# Patient Record
Sex: Male | Born: 1945
Health system: Southern US, Community
[De-identification: ages and names within clinical notes are randomized; demographics above are authoritative.]

## PROBLEM LIST (undated history)

## (undated) DIAGNOSIS — D509 Iron deficiency anemia, unspecified: Secondary | ICD-10-CM

## (undated) DIAGNOSIS — E7800 Pure hypercholesterolemia, unspecified: Secondary | ICD-10-CM

## (undated) DIAGNOSIS — K635 Polyp of colon: Secondary | ICD-10-CM

## (undated) DIAGNOSIS — I447 Left bundle-branch block, unspecified: Secondary | ICD-10-CM

## (undated) DIAGNOSIS — E119 Type 2 diabetes mellitus without complications: Secondary | ICD-10-CM

## (undated) DIAGNOSIS — G4730 Sleep apnea, unspecified: Secondary | ICD-10-CM

## (undated) DIAGNOSIS — N200 Calculus of kidney: Secondary | ICD-10-CM

## (undated) DIAGNOSIS — D563 Thalassemia minor: Secondary | ICD-10-CM

## (undated) DIAGNOSIS — I10 Essential (primary) hypertension: Secondary | ICD-10-CM

## (undated) HISTORY — DX: Thalassemia minor: D56.3

## (undated) HISTORY — DX: Left bundle-branch block, unspecified: I44.7

## (undated) HISTORY — DX: Calculus of kidney: N20.0

## (undated) HISTORY — PX: MENISCECTOMY: SHX123

## (undated) HISTORY — PX: ORIF FEMUR FRACTURE: SHX2119

## (undated) HISTORY — DX: Iron deficiency anemia, unspecified: D50.9

## (undated) HISTORY — DX: Polyp of colon: K63.5

---

## 2020-01-05 ENCOUNTER — Ambulatory Visit: Payer: Self-pay | Admitting: Family Medicine

## 2020-02-03 DIAGNOSIS — D509 Iron deficiency anemia, unspecified: Secondary | ICD-10-CM | POA: Diagnosis not present

## 2020-02-03 DIAGNOSIS — E118 Type 2 diabetes mellitus with unspecified complications: Secondary | ICD-10-CM | POA: Diagnosis not present

## 2020-02-03 DIAGNOSIS — E785 Hyperlipidemia, unspecified: Secondary | ICD-10-CM | POA: Diagnosis not present

## 2020-02-03 DIAGNOSIS — Z0001 Encounter for general adult medical examination with abnormal findings: Secondary | ICD-10-CM | POA: Diagnosis not present

## 2020-02-03 DIAGNOSIS — E538 Deficiency of other specified B group vitamins: Secondary | ICD-10-CM | POA: Diagnosis not present

## 2020-02-03 DIAGNOSIS — I10 Essential (primary) hypertension: Secondary | ICD-10-CM | POA: Diagnosis not present

## 2020-02-03 DIAGNOSIS — Z125 Encounter for screening for malignant neoplasm of prostate: Secondary | ICD-10-CM | POA: Diagnosis not present

## 2020-02-08 DIAGNOSIS — I447 Left bundle-branch block, unspecified: Secondary | ICD-10-CM | POA: Diagnosis not present

## 2020-02-08 DIAGNOSIS — D649 Anemia, unspecified: Secondary | ICD-10-CM | POA: Diagnosis not present

## 2020-02-08 DIAGNOSIS — E1169 Type 2 diabetes mellitus with other specified complication: Secondary | ICD-10-CM | POA: Diagnosis not present

## 2020-02-08 DIAGNOSIS — Z6834 Body mass index (BMI) 34.0-34.9, adult: Secondary | ICD-10-CM | POA: Diagnosis not present

## 2020-02-08 DIAGNOSIS — E785 Hyperlipidemia, unspecified: Secondary | ICD-10-CM | POA: Diagnosis not present

## 2020-02-08 DIAGNOSIS — E1122 Type 2 diabetes mellitus with diabetic chronic kidney disease: Secondary | ICD-10-CM | POA: Diagnosis not present

## 2020-02-08 DIAGNOSIS — I10 Essential (primary) hypertension: Secondary | ICD-10-CM | POA: Diagnosis not present

## 2020-02-08 DIAGNOSIS — Z0001 Encounter for general adult medical examination with abnormal findings: Secondary | ICD-10-CM | POA: Diagnosis not present

## 2020-02-14 ENCOUNTER — Encounter: Payer: Self-pay | Admitting: *Deleted

## 2020-04-24 ENCOUNTER — Ambulatory Visit: Payer: Self-pay

## 2020-05-10 DIAGNOSIS — Z79899 Other long term (current) drug therapy: Secondary | ICD-10-CM | POA: Diagnosis not present

## 2020-05-10 DIAGNOSIS — E539 Vitamin B deficiency, unspecified: Secondary | ICD-10-CM | POA: Diagnosis not present

## 2020-05-10 DIAGNOSIS — E118 Type 2 diabetes mellitus with unspecified complications: Secondary | ICD-10-CM | POA: Diagnosis not present

## 2020-05-10 DIAGNOSIS — D649 Anemia, unspecified: Secondary | ICD-10-CM | POA: Diagnosis not present

## 2020-05-10 DIAGNOSIS — E785 Hyperlipidemia, unspecified: Secondary | ICD-10-CM | POA: Diagnosis not present

## 2020-05-10 DIAGNOSIS — M1990 Unspecified osteoarthritis, unspecified site: Secondary | ICD-10-CM | POA: Diagnosis not present

## 2020-05-10 DIAGNOSIS — I10 Essential (primary) hypertension: Secondary | ICD-10-CM | POA: Diagnosis not present

## 2020-05-10 DIAGNOSIS — N183 Chronic kidney disease, stage 3 unspecified: Secondary | ICD-10-CM | POA: Diagnosis not present

## 2020-05-12 DIAGNOSIS — N1832 Chronic kidney disease, stage 3b: Secondary | ICD-10-CM | POA: Diagnosis not present

## 2020-05-12 DIAGNOSIS — M179 Osteoarthritis of knee, unspecified: Secondary | ICD-10-CM | POA: Diagnosis not present

## 2020-05-12 DIAGNOSIS — D508 Other iron deficiency anemias: Secondary | ICD-10-CM | POA: Diagnosis not present

## 2020-05-12 DIAGNOSIS — E785 Hyperlipidemia, unspecified: Secondary | ICD-10-CM | POA: Diagnosis not present

## 2020-05-12 DIAGNOSIS — E1122 Type 2 diabetes mellitus with diabetic chronic kidney disease: Secondary | ICD-10-CM | POA: Diagnosis not present

## 2020-05-30 ENCOUNTER — Other Ambulatory Visit: Payer: Self-pay

## 2020-05-30 ENCOUNTER — Ambulatory Visit (INDEPENDENT_AMBULATORY_CARE_PROVIDER_SITE_OTHER): Payer: Self-pay | Admitting: *Deleted

## 2020-05-30 VITALS — Ht 65.0 in | Wt 208.4 lb

## 2020-05-30 DIAGNOSIS — Z1211 Encounter for screening for malignant neoplasm of colon: Secondary | ICD-10-CM

## 2020-05-30 NOTE — Progress Notes (Addendum)
Gastroenterology Pre-Procedure Review  Request Date: 05/30/2020 Requesting Physician: Dr. Willey Blade, Last TCS done 20 years ago in [place], pt could not remember physician or place it was done at, polyps (benign) per pt, family hx colon cancer (mother)  PATIENT REVIEW QUESTIONS: The patient responded to the following health history questions as indicated:    1. Diabetes Melitis: yes 2. Joint replacements in the past 12 months: no 3. Major health problems in the past 3 months: no 4. Has an artificial valve or MVP: no 5. Has a defibrillator: no 6. Has been advised in past to take antibiotics in advance of a procedure like teeth cleaning: no 7. Family history of colon cancer: yes, mother: age 3  8. Alcohol Use: no 9. Illicit drug Use: no 10. History of sleep apnea: yes, CPAP  11. History of coronary artery or other vascular stents placed within the last 12 months: no 12. History of any prior anesthesia complications: no 13. Body mass index is 34.68 kg/m.    MEDICATIONS & ALLERGIES:    Patient reports the following regarding taking any blood thinners:   Plavix? no Aspirin? yes Coumadin? no Brilinta? no Xarelto? no Eliquis? no Pradaxa? no Savaysa? no Effient? no  Patient confirms/reports the following medications:  Current Outpatient Medications  Medication Sig Dispense Refill  . aspirin EC 81 MG tablet Take 81 mg by mouth daily. Swallow whole.    Marland Kitchen atorvastatin (LIPITOR) 40 MG tablet Take 40 mg by mouth daily.    . Cholecalciferol (EQL VITAMIN D3) 50 MCG (2000 UT) CAPS Take by mouth in the morning and at bedtime.    Marland Kitchen icosapent Ethyl (VASCEPA) 1 g capsule Take 1 g by mouth daily.    . Insulin Degludec (TRESIBA) 100 UNIT/ML SOLN Inject 25 mLs into the skin at bedtime.    Marland Kitchen loratadine-pseudoephedrine (CLARITIN-D 24-HOUR) 10-240 MG 24 hr tablet Take 1 tablet by mouth daily.    . pioglitazone (ACTOS) 30 MG tablet Take 30 mg by mouth daily.    . ramipril (ALTACE) 5 MG capsule Take 5  mg by mouth daily.    . vitamin B-12 (CYANOCOBALAMIN) 1000 MCG tablet Take 1,000 mcg by mouth daily.     No current facility-administered medications for this visit.    Patient confirms/reports the following allergies:  No Known Allergies  No orders of the defined types were placed in this encounter.   AUTHORIZATION INFORMATION Primary Insurance: Salli Quarry,  [place] #: [id number] Pre-Cert / Josem Kaufmann required: Yes, approved per Deidre from 75/05/183-35/82/5189 Pre-Cert / Auth #: [id number]  SCHEDULE INFORMATION: Procedure has been scheduled as follows:  Date: 06/30/2020, Time: 9:00 Location: [facility] with Dr. Abbey Chatters  This Gastroenterology Pre-Precedure Review Form is being routed to the following provider(s): Aliene Altes, PA

## 2020-05-30 NOTE — Progress Notes (Signed)
Ok to proceed with coloscopy. ASA II  Diabetes medication adjustments:  1 day prior: 1/2 dose Tresiba (12.5 mL) at bedtime, 1/2 dose Actose (15 mg) Day of procedure: Hold morning diabetes medications

## 2020-05-30 NOTE — Progress Notes (Signed)
Stopped iron around 02/2020 due to constipation.  Pt says he has appt tomorrow at [facility] to possibly start iron infusions.

## 2020-05-30 NOTE — Patient Instructions (Addendum)
[place]   Please notify us immediately if you are diabetic, take iron supplements, or if you are on coumadin or any blood thinners.   Patient Name: William Washington  Date of procedure: 07/07/2020 Time to register at [facility] Stay: 8:00 am Provider: Dr. Abbey Chatters   Purchase: MIRALAX 238 gram bottle, 1 FLEET ENEMA, 1 box of DULCOLAX (All over the counter medications)    07/05/2020- 2 Days prior to procedure: START CLEAR LIQUID DIET AFTER YOUR LUNCH MEAL--NO SOLID FOODS!   07/06/2020- 1 Day prior to procedure:   CLEAR LIQUIDS ALL DAY--NO SOLID FOODS!   Diabetic medication adjustments for today:    At 10:00 AM, take 2 DULCOLAX 9m tablets   At 12:00 PM, Mix 5 teaspoons of Miralax in any 4-6 ounces of CLEAR LIQUIDS (Gatorade) every hour for 5 hours until passing clear, watery stools. Be sure to drink 4 ounces of clear liquid 30 minutes after each dose of Miralax.   At 3:00 PM, take 2 Dulcolax 564mtablets   If stools are not clear & watery by 6:00 PM, take 5 teaspoons of Miralax every 30 minutes until stools are clear (no color)   You must have a complete prep to ensure the most effective cleaning.   CONTINUE CLEAR LIQUIDS ONLY UNTIL MIDNIGHT. Make a conscious effort to drink as much as you can before, during & after the preparation.    NOTHING TO EAT OR DRINK AFTER MIDNIGHT except for your heart, blood pressure & breathing medications. You may take them with a sip of clear liquids.    07/07/2020- Day of Procedure  Give yourself one Fleet enema about 1 hour prior to leaving for the hospital.   Diabetic medication adjustments for today:   You may take TYLENOL products. Please continue your regular medications unless we have instructed you otherwise.    Please note, on the day of your procedure you MUST be accompanied by an adult who is willing to assume responsibility for you at time of discharge. If you do not have such person with you,  your procedure will have to be rescheduled.                                                             Please leave ALL jewelry at home prior to coming to the hospital for your procedure.   *It is your responsibility to check with your insurance company for the benefits of coverage you have for this procedure. Unfortunately, not all insurance companies have benefits to cover all or part of these types of procedures. It is your responsibility to check your benefits, however we will be glad to assist you with any codes your insurance company may need.   Please note that most insurance companies will not cover a screening colonoscopy for people under the age of 5081For example, with some insurance companies you may have benefits for a screening colonoscopy, but if polyps are found the diagnosis will change and then you may have a deductible that will need to be met. Please make sure you check your benefits for screening colonoscopy as well as a diagnostic colonoscopy.    CLEAR LIQUIDS: (NO RED)  Jello Apple Juice White Grape Juice Water  Banana popsicles Kool-Aid Coffee(No cream or milk)  Tea (No cream or milk) Soft drinks Broth (fat free beef/chicken/vegetable)   Clear liquids allow you to see your fingers on the other side of the glass. Be sure they are NOT RED in color, cloudy, but CLEAR.   Do Not Eat:  Dairy products of any kind Cranberry juice  Tomato or V8 Juice Orange Juice  Grapefruit Juice Red Grape Juice  Solid foods like cereal, oatmeal, yogurt, fruits, vegetables, creamed soups, eggs, bread, etc   HELPFUL HINTS TO MAKE DRINKING EASIER:  -Make sure prep is extremely COLD. Refrigerate the night before. You may also put in freezer.  -You may try mixing Crystal Light or Country Time Lemonade if you prefer. MIx in small amounts. Add more if necessary.  -Trying drinking through a straw.  -Rinse mouth with water or mouthwash  between glasses to remove aftertaste.  -Try sipping on a cold beverage/ice popsicles between glasses of prep.  -Place a piece of sugar-free hard candy in mouth between glasses.  -If you become nauseated, try consuming smaller amounts or stretch out the time between glasses. Stop for 30 minutes to an hour & slowly start back drinking.   Call our office with any questions or concerns at [phone].   Thank You

## 2020-05-31 ENCOUNTER — Telehealth: Payer: Self-pay | Admitting: *Deleted

## 2020-05-31 ENCOUNTER — Other Ambulatory Visit: Payer: Self-pay

## 2020-05-31 ENCOUNTER — Inpatient Hospital Stay (HOSPITAL_COMMUNITY): Payer: Medicare HMO

## 2020-05-31 ENCOUNTER — Inpatient Hospital Stay (HOSPITAL_COMMUNITY): Payer: Medicare HMO | Attending: Hematology | Admitting: Hematology

## 2020-05-31 ENCOUNTER — Encounter: Payer: Self-pay | Admitting: *Deleted

## 2020-05-31 VITALS — BP 130/69 | HR 88 | Temp 97.1°F | Resp 18 | Ht 65.0 in | Wt 208.3 lb

## 2020-05-31 DIAGNOSIS — N1831 Chronic kidney disease, stage 3a: Secondary | ICD-10-CM

## 2020-05-31 DIAGNOSIS — D509 Iron deficiency anemia, unspecified: Secondary | ICD-10-CM | POA: Diagnosis not present

## 2020-05-31 DIAGNOSIS — I10 Essential (primary) hypertension: Secondary | ICD-10-CM

## 2020-05-31 DIAGNOSIS — R718 Other abnormality of red blood cells: Secondary | ICD-10-CM

## 2020-05-31 DIAGNOSIS — E538 Deficiency of other specified B group vitamins: Secondary | ICD-10-CM | POA: Insufficient documentation

## 2020-05-31 DIAGNOSIS — E1121 Type 2 diabetes mellitus with diabetic nephropathy: Secondary | ICD-10-CM

## 2020-05-31 DIAGNOSIS — E119 Type 2 diabetes mellitus without complications: Secondary | ICD-10-CM | POA: Insufficient documentation

## 2020-05-31 LAB — FOLATE: Folate: 8.5 ng/mL (ref >5.9)

## 2020-05-31 LAB — CBC WITH DIFFERENTIAL/PLATELET
Abs Immature Granulocytes: 0.04 10*3/uL (ref 0.00–0.07)
Basophils Absolute: 0 10*3/uL (ref 0.0–0.1)
Basophils Relative: 1 %
Eosinophils Absolute: 0.2 10*3/uL (ref 0.0–0.5)
Eosinophils Relative: 3 %
HCT: 29.6 % — ABNORMAL LOW (ref 39.0–52.0)
Hemoglobin: 9.1 g/dL — ABNORMAL LOW (ref 13.0–17.0)
Immature Granulocytes: 1 %
Lymphocytes Relative: 24 %
Lymphs Abs: 1.5 10*3/uL (ref 0.7–4.0)
MCH: 20.4 pg — ABNORMAL LOW (ref 26.0–34.0)
MCHC: 30.7 g/dL (ref 30.0–36.0)
MCV: 66.5 fL — ABNORMAL LOW (ref 80.0–100.0)
Monocytes Absolute: 0.6 10*3/uL (ref 0.1–1.0)
Monocytes Relative: 9 %
Neutro Abs: 3.7 10*3/uL (ref 1.7–7.7)
Neutrophils Relative %: 62 %
Platelets: 190 10*3/uL (ref 150–400)
RBC: 4.45 MIL/uL (ref 4.22–5.81)
RDW: 17.7 % — ABNORMAL HIGH (ref 11.5–15.5)
WBC: 6 10*3/uL (ref 4.0–10.5)
nRBC: 0 % (ref 0.0–0.2)

## 2020-05-31 LAB — FERRITIN: Ferritin: 18 ng/mL — ABNORMAL LOW (ref 24–336)

## 2020-05-31 LAB — RETICULOCYTES
Immature Retic Fract: 36.1 % — ABNORMAL HIGH (ref 2.3–15.9)
RBC.: 4.6 MIL/uL (ref 4.22–5.81)
Retic Count, Absolute: 121.4 10*3/uL (ref 19.0–186.0)
Retic Ct Pct: 2.6 % (ref 0.4–3.1)

## 2020-05-31 LAB — IRON AND TIBC
Iron: 62 ug/dL (ref 45–182)
Saturation Ratios: 15 % — ABNORMAL LOW (ref 17.9–39.5)
TIBC: 421 ug/dL (ref 250–450)
UIBC: 359 ug/dL

## 2020-05-31 LAB — LACTATE DEHYDROGENASE: LDH: 221 U/L — ABNORMAL HIGH (ref 98–192)

## 2020-05-31 LAB — VITAMIN B12: Vitamin B-12: 1040 pg/mL — ABNORMAL HIGH (ref 180–914)

## 2020-05-31 LAB — VITAMIN D 25 HYDROXY (VIT D DEFICIENCY, FRACTURES): Vit D, 25-Hydroxy: 40.82 ng/mL (ref 30–100)

## 2020-05-31 NOTE — Progress Notes (Signed)
Mailed letter to pt with diabetes medication adjustments.   

## 2020-05-31 NOTE — Telephone Encounter (Signed)
Mailed letter to pt with diabetes medication adjustments.   

## 2020-05-31 NOTE — Progress Notes (Signed)
CONSULT NOTE  Patient Care Team: Asencion Noble, MD as PCP - General (Internal Medicine) Gala Romney Cristopher Estimable, MD as Consulting Physician (Gastroenterology)  CHIEF COMPLAINTS/PURPOSE OF CONSULTATION: Iron deficiency anemia  HISTORY OF PRESENTING ILLNESS:  William Washington 74 y.o. male was sent here by Dr. Willey Blade his PCP for better management of iron deficiency anemia.  Patient reports he has been anemic since childhood.  He is of [place] descent and has a trait for thalassemia.  Patient reports he is trying to take oral iron therapy multiple times in the past and ends up very constipated.  He denies any bright red bleeding such as epistasis, hematuria or hematochezia.  His last colonoscopy was over 11 years ago where they saw polyps that were benign and hemorrhoids.  He is scheduled for his next colonoscopy in 1 month.  He denies a history of blood clots or blood transfusion.  He does have a history of CKD.  He also has obstructive sleep apnea where he wears a CPAP at night.  He denies any alcohol smoking or illicit drug use.  He reports his whole family is from [place] and all anemic.  He denies any pica and eats a variety of diet.  He has no prior history of diagnosis of cancer.  He has a family history positive for mother with pancreatic cancer and a daughter with breast cancer.  He lives at home alone and performs all his own ADLs.    MEDICAL HISTORY:  No past medical history on file.  SURGICAL HISTORY:   SOCIAL HISTORY: Social History   Socioeconomic History  . Marital status: Significant Other    Spouse name: Not on file  . Number of children: Not on file  . Years of education: Not on file  . Highest education level: Not on file  Occupational History  . Not on file  Tobacco Use  . Smoking status: Not on file  Substance and Sexual Activity  . Alcohol use: Not on file  . Drug use: Not on file  . Sexual activity: Not on file  Other Topics Concern  . Not on file  Social History  Narrative  . Not on file   Social Determinants of Health   Financial Resource Strain:   . Difficulty of Paying Living Expenses: Not on file  Food Insecurity:   . Worried About Charity fundraiser in the Last Year: Not on file  . Ran Out of Food in the Last Year: Not on file  Transportation Needs:   . Lack of Transportation (Medical): Not on file  . Lack of Transportation (Non-Medical): Not on file  Physical Activity:   . Days of Exercise per Week: Not on file  . Minutes of Exercise per Session: Not on file  Stress:   . Feeling of Stress : Not on file  Social Connections:   . Frequency of Communication with Friends and Family: Not on file  . Frequency of Social Gatherings with Friends and Family: Not on file  . Attends Religious Services: Not on file  . Active Member of Clubs or Organizations: Not on file  . Attends Archivist Meetings: Not on file  . Marital Status: Not on file  Intimate Partner Violence:   . Fear of Current or Ex-Partner: Not on file  . Emotionally Abused: Not on file  . Physically Abused: Not on file  . Sexually Abused: Not on file    FAMILY HISTORY: No family history on file.  ALLERGIES:  has No Known Allergies.  MEDICATIONS:  Current Outpatient Medications  Medication Sig Dispense Refill  . aspirin EC 81 MG tablet Take 81 mg by mouth daily. Swallow whole.    Marland Kitchen atorvastatin (LIPITOR) 40 MG tablet Take 40 mg by mouth daily.    . Cholecalciferol (EQL VITAMIN D3) 50 MCG (2000 UT) CAPS Take by mouth in the morning and at bedtime.    . Insulin Degludec (TRESIBA) 100 UNIT/ML SOLN Inject 25 mLs into the skin at bedtime.    Marland Kitchen loratadine-pseudoephedrine (CLARITIN-D 24-HOUR) 10-240 MG 24 hr tablet Take 1 tablet by mouth daily.    . Omega-3 Fatty Acids (FISH OIL) 1000 MG CAPS Take 2 capsules by mouth 2 (two) times daily.    . pioglitazone (ACTOS) 30 MG tablet Take 30 mg by mouth daily.    . ramipril (ALTACE) 5 MG capsule Take 5 mg by mouth daily.     . Turmeric (QC TUMERIC COMPLEX) 500 MG CAPS Take by mouth. Takes 2 capsules daily.    . vitamin B-12 (CYANOCOBALAMIN) 1000 MCG tablet Take 1,000 mcg by mouth daily.     No current facility-administered medications for this visit.    REVIEW OF SYSTEMS:   Constitutional: Denies fevers, chills or abnormal night sweats Eyes: Denies blurriness of vision, double vision or watery eyes Ears, nose, mouth, throat, and face: Denies mucositis or sore throat Respiratory: Denies cough, dyspnea or wheezes, +SOB Cardiovascular: Denies palpitation, chest discomfort or lower extremity swelling Gastrointestinal:  Denies nausea, heartburn or change in bowel habits Skin: Denies abnormal skin rashes Lymphatics: Denies new lymphadenopathy or easy bruising Neurological:Denies numbness, tingling or new weaknesses Behavioral/Psych: Mood is stable, no new changes  All other systems were reviewed with the patient and are negative.  PHYSICAL EXAMINATION: ECOG PERFORMANCE STATUS: 1 - Symptomatic but completely ambulatory  Vitals:   05/31/20 1004  BP: 130/69  Pulse: 88  Resp: 18  Temp: (!) 97.1 F (36.2 C)  SpO2: 99%   Filed Weights   05/31/20 1004  Weight: 208 lb 4.8 oz (94.5 kg)    GENERAL:alert, no distress and comfortable SKIN: skin color, texture, turgor are normal, no rashes or significant lesions NECK: supple, thyroid normal size, non-tender, without nodularity LYMPH:  no palpable lymphadenopathy in the cervical, axillary or inguinal LUNGS: clear to auscultation and percussion with normal breathing effort HEART: regular rate & rhythm and no murmurs and no lower extremity edema ABDOMEN:abdomen soft, non-tender and normal bowel sounds Musculoskeletal:no cyanosis of digits and no clubbing  PSYCH: alert & oriented x 3 with fluent speech NEURO: no focal motor/sensory deficits  LABORATORY DATA:  I have reviewed the data as listed Recent Results (from the past 2160 hour(s))  Reticulocytes      Status: Abnormal   Collection Time: 05/31/20 12:11 PM  Result Value Ref Range   Retic Ct Pct 2.6 0.4 - 3.1 %   RBC. 4.60 4.22 - 5.81 MIL/uL   Retic Count, Absolute 121.4 19.0 - 186.0 K/uL   Immature Retic Fract 36.1 (H) 2.3 - 15.9 %    Comment: Performed at [facility], [address] [place], [place] [address]  Lactate dehydrogenase     Status: Abnormal   Collection Time: 05/31/20 12:12 PM  Result Value Ref Range   LDH 221 (H) 98 - 192 U/L    Comment: Performed at [facility], [address] [place], [place] [address]    RADIOGRAPHIC STUDIES: I have personally reviewed the radiological images as listed and agreed with the  findings in the report. I have independently interviewed and examined this patient.  I agree with H&P written by my nurse practitioner Wenda Low, FNP.  I have independently formulated my assessment and plan.  ASSESSMENT & PLAN:  1.  Microcytic anemia: -Patient seen at the request of Dr. Willey Blade.  CBC on 05/10/2020 showed hemoglobin 9.9 with MCV of 64.  White count and platelets were normal.  Vitamin B12 was 312. -CBC on 02/03/2020 with hemoglobin 9.3, hematocrit 32, platelet count 306. -He has a history of thalassemia trait.  He has [place] ancestry.  Most of his family members reportedly have thalassemia trait. -He denies any history of blood transfusion.  Family history with mother with pancreatic cancer and daughter with breast cancer. -Last colonoscopy was more than 10 years ago.  Denies any bleeding per rectum or melena. -Cannot tolerate iron pills secondary to severe constipation. -We will check his CBC today.  We will check for other nutritional deficiencies and hemolysis. -I have recommended Feraheme weekly x2.  We discussed about side effects. -We will plan to see him back in 6 to 8 weeks with repeat CBC.  2.  CKD: -He has chronic kidney disease with creatinine around 1.5.  3.  B12 deficiency: -B12 level was low at 217 on 02/03/2020. -He has been  taking B12 1 mg tablet daily since then.  4.  Diabetes: -Continue Tresiba and pioglitazone.   All questions were answered. The patient knows to call the clinic with any problems, questions or concerns.      Derek Jack, MD 05/31/20 12:53 PM

## 2020-06-01 ENCOUNTER — Other Ambulatory Visit: Payer: Self-pay | Admitting: *Deleted

## 2020-06-01 ENCOUNTER — Telehealth: Payer: Self-pay | Admitting: *Deleted

## 2020-06-01 LAB — PROTEIN ELECTROPHORESIS, SERUM
A/G Ratio: 1.3 (ref 0.7–1.7)
Albumin ELP: 3.5 g/dL (ref 2.9–4.4)
Alpha-1-Globulin: 0.2 g/dL (ref 0.0–0.4)
Alpha-2-Globulin: 0.6 g/dL (ref 0.4–1.0)
Beta Globulin: 1 g/dL (ref 0.7–1.3)
Gamma Globulin: 1 g/dL (ref 0.4–1.8)
Globulin, Total: 2.8 g/dL (ref 2.2–3.9)
Total Protein ELP: 6.3 g/dL (ref 6.0–8.5)

## 2020-06-01 LAB — HAPTOGLOBIN: Haptoglobin: 78 mg/dL (ref 34–355)

## 2020-06-01 NOTE — Telephone Encounter (Signed)
Called [employer] and received authorization for procedure.  Good from 06/30/2020-07/30/2020 per Deidre. Auth #: [id number]

## 2020-06-02 ENCOUNTER — Other Ambulatory Visit: Payer: Self-pay

## 2020-06-02 ENCOUNTER — Inpatient Hospital Stay (HOSPITAL_COMMUNITY): Payer: Medicare HMO

## 2020-06-02 VITALS — BP 134/61 | HR 93 | Temp 96.8°F | Resp 18

## 2020-06-02 DIAGNOSIS — D509 Iron deficiency anemia, unspecified: Secondary | ICD-10-CM | POA: Diagnosis not present

## 2020-06-02 DIAGNOSIS — E538 Deficiency of other specified B group vitamins: Secondary | ICD-10-CM | POA: Diagnosis not present

## 2020-06-02 DIAGNOSIS — E1121 Type 2 diabetes mellitus with diabetic nephropathy: Secondary | ICD-10-CM | POA: Diagnosis not present

## 2020-06-02 DIAGNOSIS — I10 Essential (primary) hypertension: Secondary | ICD-10-CM | POA: Diagnosis not present

## 2020-06-02 DIAGNOSIS — N1831 Chronic kidney disease, stage 3a: Secondary | ICD-10-CM | POA: Diagnosis not present

## 2020-06-02 MED ORDER — SODIUM CHLORIDE 0.9 % IV SOLN
510.0000 mg | Freq: Once | INTRAVENOUS | Status: AC
  Administered 2020-06-02: 510 mg via INTRAVENOUS
  Filled 2020-06-02: qty 510

## 2020-06-02 MED ORDER — SODIUM CHLORIDE 0.9 % IV SOLN: Freq: Once | INTRAVENOUS | Status: AC

## 2020-06-02 NOTE — Patient Instructions (Signed)
[facility]  Discharge Instructions:   _______________________________________________________________  Thank you for choosing [facility] at [facility] to provide your oncology and hematology care.  To afford each patient quality time with our providers, please arrive at least 15 minutes before your scheduled appointment.  You need to re-schedule your appointment if you arrive 10 or more minutes late.  We strive to give you quality time with our providers, and arriving late affects you and other patients whose appointments are after yours.  Also, if you no show three or more times for appointments you may be dismissed from the clinic.  Again, thank you for choosing [facility] at [facility]. Our hope is that these requests will allow you access to exceptional care and in a timely manner. _______________________________________________________________  If you have questions after your visit, please contact our office at [phone] between the hours of 8:30 a.m. and 5:00 p.m. Voicemails left after 4:30 p.m. will not be returned until the following business day. _______________________________________________________________  For prescription refill requests, have your pharmacy contact our office. _______________________________________________________________  Recommendations made by the consultant and any test results will be sent to your referring physician. _______________________________________________________________ 

## 2020-06-02 NOTE — Progress Notes (Signed)
Iron infusion given per orders. Patient tolerated it well without problems. Vitals stable and discharged home from clinic ambulatory in stable condition. Follow up as scheduled.  

## 2020-06-06 LAB — COPPER, SERUM: Copper: 99 ug/dL (ref 69–132)

## 2020-06-07 LAB — METHYLMALONIC ACID, SERUM: Methylmalonic Acid, Quantitative: 207 nmol/L (ref 0–378)

## 2020-06-09 ENCOUNTER — Ambulatory Visit (HOSPITAL_COMMUNITY): Payer: Medicare HMO

## 2020-06-12 ENCOUNTER — Other Ambulatory Visit: Payer: Self-pay

## 2020-06-12 ENCOUNTER — Encounter (HOSPITAL_COMMUNITY): Payer: Self-pay

## 2020-06-12 ENCOUNTER — Telehealth: Payer: Self-pay | Admitting: Internal Medicine

## 2020-06-12 ENCOUNTER — Inpatient Hospital Stay (HOSPITAL_COMMUNITY): Payer: Medicare HMO | Attending: Hematology

## 2020-06-12 VITALS — BP 141/71 | HR 77 | Temp 98.0°F | Resp 16

## 2020-06-12 DIAGNOSIS — N1831 Chronic kidney disease, stage 3a: Secondary | ICD-10-CM | POA: Diagnosis not present

## 2020-06-12 DIAGNOSIS — E1121 Type 2 diabetes mellitus with diabetic nephropathy: Secondary | ICD-10-CM | POA: Diagnosis not present

## 2020-06-12 DIAGNOSIS — D509 Iron deficiency anemia, unspecified: Secondary | ICD-10-CM | POA: Insufficient documentation

## 2020-06-12 DIAGNOSIS — I10 Essential (primary) hypertension: Secondary | ICD-10-CM | POA: Diagnosis not present

## 2020-06-12 DIAGNOSIS — E538 Deficiency of other specified B group vitamins: Secondary | ICD-10-CM | POA: Insufficient documentation

## 2020-06-12 MED ORDER — SODIUM CHLORIDE 0.9 % IV SOLN: Freq: Once | INTRAVENOUS | Status: AC

## 2020-06-12 MED ORDER — SODIUM CHLORIDE 0.9 % IV SOLN
510.0000 mg | Freq: Once | INTRAVENOUS | Status: AC
  Administered 2020-06-12: 510 mg via INTRAVENOUS
  Filled 2020-06-12: qty 510

## 2020-06-12 NOTE — Telephone Encounter (Signed)
Patient called to reschedule his procedure

## 2020-06-12 NOTE — Progress Notes (Signed)
Tolerated iron infusion well today.  Vital signs stable prior to discharge.  Discharged in stable condition ambulatory.

## 2020-06-12 NOTE — Patient Instructions (Signed)
[facility]  Discharge Instructions:   _______________________________________________________________  Thank you for choosing [facility] at [facility] to provide your oncology and hematology care.  To afford each patient quality time with our providers, please arrive at least 15 minutes before your scheduled appointment.  You need to re-schedule your appointment if you arrive 10 or more minutes late.  We strive to give you quality time with our providers, and arriving late affects you and other patients whose appointments are after yours.  Also, if you no show three or more times for appointments you may be dismissed from the clinic.  Again, thank you for choosing [facility] at [facility]. Our hope is that these requests will allow you access to exceptional care and in a timely manner. _______________________________________________________________  If you have questions after your visit, please contact our office at [phone] between the hours of 8:30 a.m. and 5:00 p.m. Voicemails left after 4:30 p.m. will not be returned until the following business day. _______________________________________________________________  For prescription refill requests, have your pharmacy contact our office. _______________________________________________________________  Recommendations made by the consultant and any test results will be sent to your referring physician. _______________________________________________________________ 

## 2020-06-13 ENCOUNTER — Ambulatory Visit (INDEPENDENT_AMBULATORY_CARE_PROVIDER_SITE_OTHER): Payer: Medicare HMO

## 2020-06-13 ENCOUNTER — Ambulatory Visit (INDEPENDENT_AMBULATORY_CARE_PROVIDER_SITE_OTHER): Payer: Medicare HMO | Admitting: Orthopedic Surgery

## 2020-06-13 ENCOUNTER — Encounter: Payer: Self-pay | Admitting: *Deleted

## 2020-06-13 ENCOUNTER — Encounter: Payer: Self-pay | Admitting: Orthopedic Surgery

## 2020-06-13 VITALS — BP 140/74 | HR 91 | Ht 65.0 in | Wt 206.0 lb

## 2020-06-13 DIAGNOSIS — E782 Mixed hyperlipidemia: Secondary | ICD-10-CM

## 2020-06-13 DIAGNOSIS — M25561 Pain in right knee: Secondary | ICD-10-CM

## 2020-06-13 DIAGNOSIS — G8929 Other chronic pain: Secondary | ICD-10-CM | POA: Diagnosis not present

## 2020-06-13 DIAGNOSIS — M1711 Unilateral primary osteoarthritis, right knee: Secondary | ICD-10-CM

## 2020-06-13 DIAGNOSIS — E785 Hyperlipidemia, unspecified: Secondary | ICD-10-CM | POA: Insufficient documentation

## 2020-06-13 NOTE — Patient Instructions (Signed)

## 2020-06-13 NOTE — Progress Notes (Signed)
New Patient Visit  Assessment: William Washington is a 74 y.o. male with the following: 1. Right knee arthritis; primarily medial compartment   Plan: I had extensive discussion with Mr. Orihuela in clinic today regarding his right knee.  We reviewed the x-rays, which demonstrates a possible osteochondral lesion within the medial femoral condyle.  This is likely contributing to some arthritis type pain.  He specifically denies any mechanical symptoms, and states that it does not feel as though there is a loose body within his knee.  As a result, I have encouraged him to continue taking medications as needed including Tylenol and NSAIDs.  We also discussed the possibility of proceeding with a steroid injection.  He has elected to proceed.  Otherwise, he should remain as active as possible.  All questions were answered he is amenable to this plan.  He has any further issues with his right knee, her left knee starts to bother him, I have encouraged him to contact the clinic for another appointment   Procedure note injection Right knee joint   Verbal consent was obtained to inject the right knee joint  Timeout was completed to confirm the site of injection.  The skin was prepped with alcohol and ethyl chloride was sprayed at the injection site.  A 21-gauge needle was used to inject 40 mg of Depo-Medrol and 1% lidocaine (3 cc) into the right knee using an anterolateral approach.  There were no complications. A sterile bandage was applied.     Follow-up: Return if symptoms worsen or fail to improve.  Subjective:  Chief Complaint  Patient presents with  . Knee Pain    right knee pain, chronic, worsening x 6 months    History of Present Illness: William Washington is a 74 y.o. male who has been referred to clinic today by Asencion Noble, MD for evaluation of right knee pain.  He states his right knee has been bothering him over the last 6 months.  His pain has been progressively worsening.  The pain is  primarily located in the medial aspect of the knee.  He denies a specific injury, but notes that he has had multiple procedures on his left knee, including a femur fracture within the last 2 years, which is put more stress on his right leg.  He also denies a history of any knee pain as a child or young teenager.  He has not been doing physical therapy, but has remained active.  Review of Systems: No fevers or chills No numbness or tingling No chest pain No shortness of breath  Medical History:  Diabetes Hypertension Dyslipidemia  Left knee arthroscopy Left femur IMN  Family History  Problem Relation Age of Onset  . Severe sprains Neg Hx   . Scoliosis Neg Hx    Social History   Tobacco Use  . Smoking status: Never Smoker  . Smokeless tobacco: Never Used  Substance Use Topics  . Alcohol use: Not on file  . Drug use: Not on file    No Known Allergies  Current Meds  Medication Sig  . aspirin EC 81 MG tablet Take 81 mg by mouth daily. Swallow whole.  Marland Kitchen atorvastatin (LIPITOR) 40 MG tablet Take 40 mg by mouth daily.  . Cholecalciferol (EQL VITAMIN D3) 50 MCG (2000 UT) CAPS Take by mouth in the morning and at bedtime.  . Insulin Degludec (TRESIBA) 100 UNIT/ML SOLN Inject 25 mLs into the skin at bedtime.  . iron dextran complex in sodium chloride 0.9 %  500 mL Inject into the vein once.  . loratadine-pseudoephedrine (CLARITIN-D 24-HOUR) 10-240 MG 24 hr tablet Take 1 tablet by mouth daily.  . Omega-3 Fatty Acids (FISH OIL) 1000 MG CAPS Take 2 capsules by mouth 2 (two) times daily.  . pioglitazone (ACTOS) 30 MG tablet Take 30 mg by mouth daily.  . ramipril (ALTACE) 5 MG capsule Take 5 mg by mouth daily.  . Turmeric (QC TUMERIC COMPLEX) 500 MG CAPS Take by mouth. Takes 2 capsules daily.  . vitamin B-12 (CYANOCOBALAMIN) 1000 MCG tablet Take 1,000 mcg by mouth daily.    Objective: BP 140/74   Pulse 91   Ht 5\' 5"  (1.651 m)   Wt 206 lb (93.4 kg)   BMI 34.28 kg/m   Physical  Exam:  General: Alert and oriented, no acute distress Gait: Mild right sided antalgic gait  Evaluation of the right knee demonstrates no obvious deformity Mild effusion is appreciated Tenderness to palpation along the medial joint line Negative McMurray's Range of motion motion from 0-120 degrees  Negative Lachman  Evaluation of left knee demonstrates well-healed surgical incisions. No effusion is appreciated Mild tenderness palpation along the medial and lateral joint line Range of motion from 15-120 degrees Negative Lachman Negative McMurray's     IMAGING: I personally ordered and reviewed the following images:  X-ray of the right knee demonstrates neutral overall alignment.  Joint spaces are well-maintained.  The medial femoral condyle has what appears to be an osteochondral fragment, that is not displaced.  Small osteophytes are noted.  New Medications:  No orders of the defined types were placed in this encounter.     William Rasmussen, MD  06/13/2020 3:45 PM

## 2020-06-13 NOTE — Telephone Encounter (Addendum)
Spoke with pt and he rescheduled his procedure to 07/07/2020 with arrival time at 8:00 and his Covid screening to 07/05/2020 at 11:05.  Pt aware that I will mail out new prep instructions, diabetes medication adjustment letter and Covid screening info.  Lmom for [facility] in Endo.

## 2020-06-28 ENCOUNTER — Other Ambulatory Visit (HOSPITAL_COMMUNITY): Payer: Medicare HMO

## 2020-07-04 ENCOUNTER — Encounter (HOSPITAL_COMMUNITY): Payer: Self-pay

## 2020-07-05 ENCOUNTER — Other Ambulatory Visit (HOSPITAL_COMMUNITY)
Admission: RE | Admit: 2020-07-05 | Discharge: 2020-07-05 | Disposition: A | Discharge status: Home / Self Care | Payer: Medicare HMO | Source: Ambulatory Visit | Attending: Internal Medicine | Admitting: Internal Medicine

## 2020-07-05 ENCOUNTER — Other Ambulatory Visit: Payer: Self-pay

## 2020-07-05 DIAGNOSIS — Z20822 Contact with and (suspected) exposure to covid-19: Secondary | ICD-10-CM | POA: Insufficient documentation

## 2020-07-05 DIAGNOSIS — Z01812 Encounter for preprocedural laboratory examination: Secondary | ICD-10-CM | POA: Insufficient documentation

## 2020-07-05 LAB — SARS CORONAVIRUS 2 (TAT 6-24 HRS): SARS Coronavirus 2: NEGATIVE

## 2020-07-07 ENCOUNTER — Ambulatory Visit (HOSPITAL_COMMUNITY)
Admission: RE | Admit: 2020-07-07 | Discharge: 2020-07-07 | Disposition: A | Discharge status: Home / Self Care | Payer: Medicare HMO | Attending: Internal Medicine | Admitting: Internal Medicine

## 2020-07-07 ENCOUNTER — Encounter (HOSPITAL_COMMUNITY): Payer: Self-pay

## 2020-07-07 ENCOUNTER — Other Ambulatory Visit: Payer: Self-pay

## 2020-07-07 ENCOUNTER — Ambulatory Visit (HOSPITAL_COMMUNITY): Payer: Medicare HMO | Admitting: Anesthesiology

## 2020-07-07 ENCOUNTER — Encounter (HOSPITAL_COMMUNITY)
Admission: RE | Disposition: A | Discharge status: Home / Self Care | Payer: Self-pay | Source: Home / Self Care | Attending: Internal Medicine

## 2020-07-07 DIAGNOSIS — K5730 Diverticulosis of large intestine without perforation or abscess without bleeding: Secondary | ICD-10-CM | POA: Insufficient documentation

## 2020-07-07 DIAGNOSIS — E119 Type 2 diabetes mellitus without complications: Secondary | ICD-10-CM | POA: Diagnosis not present

## 2020-07-07 DIAGNOSIS — K635 Polyp of colon: Secondary | ICD-10-CM | POA: Diagnosis not present

## 2020-07-07 DIAGNOSIS — Z1211 Encounter for screening for malignant neoplasm of colon: Secondary | ICD-10-CM

## 2020-07-07 DIAGNOSIS — Z794 Long term (current) use of insulin: Secondary | ICD-10-CM | POA: Insufficient documentation

## 2020-07-07 DIAGNOSIS — Z800 Family history of malignant neoplasm of digestive organs: Secondary | ICD-10-CM | POA: Diagnosis not present

## 2020-07-07 DIAGNOSIS — K648 Other hemorrhoids: Secondary | ICD-10-CM | POA: Insufficient documentation

## 2020-07-07 DIAGNOSIS — K5140 Inflammatory polyps of colon without complications: Secondary | ICD-10-CM | POA: Diagnosis not present

## 2020-07-07 DIAGNOSIS — D122 Benign neoplasm of ascending colon: Secondary | ICD-10-CM | POA: Diagnosis not present

## 2020-07-07 DIAGNOSIS — D123 Benign neoplasm of transverse colon: Secondary | ICD-10-CM | POA: Insufficient documentation

## 2020-07-07 HISTORY — PX: COLONOSCOPY WITH PROPOFOL: SHX5780

## 2020-07-07 HISTORY — DX: Pure hypercholesterolemia, unspecified: E78.00

## 2020-07-07 HISTORY — DX: Type 2 diabetes mellitus without complications: E11.9

## 2020-07-07 HISTORY — DX: Essential (primary) hypertension: I10

## 2020-07-07 HISTORY — PX: POLYPECTOMY: SHX5525

## 2020-07-07 LAB — GLUCOSE, CAPILLARY: Glucose-Capillary: 102 mg/dL — ABNORMAL HIGH (ref 70–99)

## 2020-07-07 SURGERY — COLONOSCOPY WITH PROPOFOL
Anesthesia: General

## 2020-07-07 MED ORDER — CHLORHEXIDINE GLUCONATE CLOTH 2 % EX PADS: 6.0000 | MEDICATED_PAD | Freq: Once | CUTANEOUS | Status: DC

## 2020-07-07 MED ORDER — STERILE WATER FOR IRRIGATION IR SOLN
Status: DC | PRN
  Administered 2020-07-07: 1.5 mL

## 2020-07-07 MED ORDER — LACTATED RINGERS IV SOLN
INTRAVENOUS | Status: DC
  Administered 2020-07-07: 1000 mL via INTRAVENOUS

## 2020-07-07 MED ORDER — PROPOFOL 500 MG/50ML IV EMUL
INTRAVENOUS | Status: DC | PRN
  Administered 2020-07-07: 150 ug/kg/min via INTRAVENOUS

## 2020-07-07 MED ORDER — PROPOFOL 10 MG/ML IV BOLUS
INTRAVENOUS | Status: AC
  Filled 2020-07-07: qty 60

## 2020-07-07 MED ORDER — PROPOFOL 10 MG/ML IV BOLUS
INTRAVENOUS | Status: DC | PRN
  Administered 2020-07-07: 20 mg via INTRAVENOUS
  Administered 2020-07-07 (×2): 50 mg via INTRAVENOUS
  Administered 2020-07-07: 20 mg via INTRAVENOUS
  Administered 2020-07-07: 40 mg via INTRAVENOUS

## 2020-07-07 NOTE — Anesthesia Preprocedure Evaluation (Addendum)
Anesthesia Evaluation  Patient identified by MRN, date of birth, ID band Patient awake    Reviewed: Allergy & Precautions, NPO status , Patient's Chart, lab work & pertinent test results, reviewed documented beta blocker date and time   History of Anesthesia Complications Negative for: history of anesthetic complications  Airway Mallampati: II  TM Distance: >3 FB Neck ROM: Full    Dental no notable dental hx.    Pulmonary neg pulmonary ROS,    Pulmonary exam normal breath sounds clear to auscultation       Cardiovascular hypertension, Normal cardiovascular exam Rhythm:Regular Rate:Normal     Neuro/Psych    GI/Hepatic   Endo/Other  diabetes, Type 2  Renal/GU      Musculoskeletal   Abdominal   Peds  Hematology  (+) anemia ,   Anesthesia Other Findings   Reproductive/Obstetrics                            Anesthesia Physical Anesthesia Plan  ASA: II  Anesthesia Plan: General   Post-op Pain Management:    Induction: Intravenous  PONV Risk Score and Plan:   Airway Management Planned: Nasal Cannula  Additional Equipment:   Intra-op Plan:   Post-operative Plan:   Informed Consent: I have reviewed the patients History and Physical, chart, labs and discussed the procedure including the risks, benefits and alternatives for the proposed anesthesia with the patient or authorized representative who has indicated his/her understanding and acceptance.     Dental advisory given  Plan Discussed with: CRNA  Anesthesia Plan Comments:         Anesthesia Quick Evaluation

## 2020-07-07 NOTE — Anesthesia Postprocedure Evaluation (Signed)
Anesthesia Post Note  Patient: William Washington  Procedure(s) Performed: COLONOSCOPY WITH PROPOFOL (N/A ) POLYPECTOMY  Patient location during evaluation: Endoscopy Anesthesia Type: General Level of consciousness: awake and alert and oriented Pain management: pain level controlled Vital Signs Assessment: post-procedure vital signs reviewed and stable Respiratory status: spontaneous breathing Cardiovascular status: blood pressure returned to baseline and stable Postop Assessment: no apparent nausea or vomiting Anesthetic complications: no   No complications documented.   Last Vitals:  Vitals:   07/07/20 0727  BP: 134/71  Pulse: 88  Resp: 12  Temp: 36.6 C    Last Pain:  Vitals:   07/07/20 0849  TempSrc:   PainSc: 0-No pain                 Sion Thane

## 2020-07-07 NOTE — H&P (Signed)
Primary Care Physician:  Asencion Noble, MD Primary Gastroenterologist:  Dr. Abbey Chatters  Pre-Procedure History & Physical: HPI:  William Washington is a 74 y.o. male is here for a colonoscopy for colon cancer screening purposes.  Patient denies any family history of colorectal cancer. Does note mother had pancreatic cancer.  No melena or hematochezia.  No abdominal pain or unintentional weight loss.  No change in bowel habits.  Overall feels well from a GI standpoint.  Past Medical History:  Diagnosis Date  . Diabetes mellitus without complication ([place])   . Hypercholesteremia   . Hypertension     Past Surgical History:  Procedure Laterality Date  . MENISCECTOMY Left   . ORIF FEMUR FRACTURE Left     Prior to Admission medications   Medication Sig Start Date End Date Taking? Authorizing Provider  aspirin EC 81 MG tablet Take 81 mg by mouth daily. Swallow whole.   Yes [provider]  atorvastatin (LIPITOR) 40 MG tablet Take 40 mg by mouth at bedtime.    Yes [provider]  Cholecalciferol (EQL VITAMIN D3) 50 MCG (2000 UT) CAPS Take 2,000 Units by mouth in the morning and at bedtime.    Yes [provider]  Insulin Degludec (TRESIBA) 100 UNIT/ML SOLN Inject 25 Units into the skin at bedtime.    Yes [provider]  iron dextran complex in sodium chloride 0.9 % 500 mL Inject into the vein once.   Yes [provider]  loratadine-pseudoephedrine (CLARITIN-D 24-HOUR) 10-240 MG 24 hr tablet Take 1 tablet by mouth every other day.    Yes [provider]  Omega-3 Fatty Acids (FISH OIL) 1000 MG CAPS Take 2,000 mg by mouth 2 (two) times daily.    Yes [provider]  oxymetazoline (AFRIN) 0.05 % nasal spray Place 1 spray into both nostrils daily as needed for congestion.   Yes [provider]  pioglitazone (ACTOS) 30 MG tablet Take 30 mg by mouth daily.   Yes [provider]  ramipril (ALTACE) 5 MG capsule Take 5 mg by mouth  daily.   Yes [provider]  Turmeric (QC TUMERIC COMPLEX) 500 MG CAPS Take 1,000 mg by mouth daily.    Yes [provider]  vitamin B-12 (CYANOCOBALAMIN) 1000 MCG tablet Take 1,000 mcg by mouth daily.   Yes [provider]    Allergies as of 05/31/2020  . (No Known Allergies)    Family History  Problem Relation Age of Onset  . Severe sprains Neg Hx   . Scoliosis Neg Hx     Social History   Socioeconomic History  . Marital status: Significant Other    Spouse name: Not on file  . Number of children: Not on file  . Years of education: Not on file  . Highest education level: Not on file  Occupational History  . Not on file  Tobacco Use  . Smoking status: Never Smoker  . Smokeless tobacco: Never Used  Vaping Use  . Vaping Use: Never used  Substance and Sexual Activity  . Alcohol use: Yes    Comment: rare  . Drug use: Never  . Sexual activity: Not on file  Other Topics Concern  . Not on file  Social History Narrative  . Not on file   Social Determinants of Health   Financial Resource Strain:   . Difficulty of Paying Living Expenses: Not on file  Food Insecurity:   . Worried About Charity fundraiser in the Last  Year: Not on file  . Ran Out of Food in the Last Year: Not on file  Transportation Needs:   . Lack of Transportation (Medical): Not on file  . Lack of Transportation (Non-Medical): Not on file  Physical Activity:   . Days of Exercise per Week: Not on file  . Minutes of Exercise per Session: Not on file  Stress:   . Feeling of Stress : Not on file  Social Connections:   . Frequency of Communication with Friends and Family: Not on file  . Frequency of Social Gatherings with Friends and Family: Not on file  . Attends Religious Services: Not on file  . Active Member of Clubs or Organizations: Not on file  . Attends Archivist Meetings: Not on file  . Marital Status: Not on file  Intimate Partner Violence:   . Fear of  Current or Ex-Partner: Not on file  . Emotionally Abused: Not on file  . Physically Abused: Not on file  . Sexually Abused: Not on file    Review of Systems: See HPI, otherwise negative ROS  Impression/Plan: William Washington is here for a colonoscopy to be performed for colon cancer screening purposes.  The risks of the procedure including infection, bleed, or perforation as well as benefits, limitations, alternatives and imponderables have been reviewed with the patient. Questions have been answered. All parties agreeable.

## 2020-07-07 NOTE — Discharge Instructions (Addendum)
Colonoscopy Discharge Instructions  Read the instructions outlined below and refer to this sheet in the next few weeks. These discharge instructions provide you with general information on caring for yourself after you leave the hospital. Your doctor may also give you specific instructions. While your treatment has been planned according to the most current medical practices available, unavoidable complications occasionally occur.   ACTIVITY  You may resume your regular activity, but move at a slower pace for the next 24 hours.   Take frequent rest periods for the next 24 hours.   Walking will help get rid of the air and reduce the bloated feeling in your belly (abdomen).   No driving for 24 hours (because of the medicine (anesthesia) used during the test).    Do not sign any important legal documents or operate any machinery for 24 hours (because of the anesthesia used during the test).  NUTRITION  Drink plenty of fluids.   You may resume your normal diet as instructed by your doctor.   Begin with a light meal and progress to your normal diet. Heavy or fried foods are harder to digest and may make you feel sick to your stomach (nauseated).   Avoid alcoholic beverages for 24 hours or as instructed.  MEDICATIONS  You may resume your normal medications unless your doctor tells you otherwise.  WHAT YOU CAN EXPECT TODAY  Some feelings of bloating in the abdomen.   Passage of more gas than usual.   Spotting of blood in your stool or on the toilet paper.  IF YOU HAD POLYPS REMOVED DURING THE COLONOSCOPY:  No aspirin products for 7 days or as instructed.   No alcohol for 7 days or as instructed.   Eat a soft diet for the next 24 hours.  FINDING OUT THE RESULTS OF YOUR TEST Not all test results are available during your visit. If your test results are not back during the visit, make an appointment with your caregiver to find out the results. Do not assume everything is normal if  you have not heard from your caregiver or the medical facility. It is important for you to follow up on all of your test results.  SEEK IMMEDIATE MEDICAL ATTENTION IF:  You have more than a spotting of blood in your stool.   Your belly is swollen (abdominal distention).   You are nauseated or vomiting.   You have a temperature over 101.   You have abdominal pain or discomfort that is severe or gets worse throughout the day.   Your colonoscopy revealed 4 polyps which I removed successfully.  Await pathology results, my office will contact you.  I recommend we repeat colonoscopy in 5 years for surveillance purposes.  Follow-up with GI as needed.  I hope you have a great rest of your week!    Elon Alas. Abbey Chatters, D.O. Gastroenterology and Hepatology [facility]   Colon Polyps  Polyps are tissue growths inside the body. Polyps can grow in many places, including the large intestine (colon). A polyp may be a round bump or a mushroom-shaped growth. You could have one polyp or several. Most colon polyps are noncancerous (benign). However, some colon polyps can become cancerous over time. Finding and removing the polyps early can help prevent this. What are the causes? The exact cause of colon polyps is not known. What increases the risk? You are more likely to develop this condition if you:  Have a family history of colon cancer or colon  polyps.  Are older than 21 or older than 45 if you are African American.  Have inflammatory bowel disease, such as ulcerative colitis or Crohn's disease.  Have certain hereditary conditions, such as: ? Familial adenomatous polyposis. ? Lynch syndrome. ? Turcot syndrome. ? Peutz-Jeghers syndrome.  Are overweight.  Smoke cigarettes.  Do not get enough exercise.  Drink too much alcohol.  Eat a diet that is high in fat and red meat and low in fiber.  Had childhood cancer that was treated with abdominal  radiation. What are the signs or symptoms? Most polyps do not cause symptoms. If you have symptoms, they may include:  Blood coming from your rectum when having a bowel movement.  Blood in your stool. The stool may look dark red or black.  Abdominal pain.  A change in bowel habits, such as constipation or diarrhea. How is this diagnosed? This condition is diagnosed with a colonoscopy. This is a procedure in which a lighted, flexible scope is inserted into the anus and then passed into the colon to examine the area. Polyps are sometimes found when a colonoscopy is done as part of routine cancer screening tests. How is this treated? Treatment for this condition involves removing any polyps that are found. Most polyps can be removed during a colonoscopy. Those polyps will then be tested for cancer. Additional treatment may be needed depending on the results of testing. Follow these instructions at home: Lifestyle  Maintain a healthy weight, or lose weight if recommended by your health care provider.  Exercise every day or as told by your health care provider.  Do not use any products that contain nicotine or tobacco, such as cigarettes and e-cigarettes. If you need help quitting, ask your health care provider.  If you drink alcohol, limit how much you have: ? 0-1 drink a day for women. ? 0-2 drinks a day for men.  Be aware of how much alcohol is in your drink. In the [place] one drink equals one 12 oz bottle of beer (355 mL), one 5 oz glass of wine (148 mL), or one 1 oz shot of hard liquor (44 mL). Eating and drinking   Eat foods that are high in fiber, such as fruits, vegetables, and whole grains.  Eat foods that are high in calcium and vitamin D, such as milk, cheese, yogurt, eggs, liver, fish, and broccoli.  Limit foods that are high in fat, such as fried foods and desserts.  Limit the amount of red meat and processed meat you eat, such as hot dogs, sausage, bacon, and lunch  meats. General instructions  Keep all follow-up visits as told by your health care provider. This is important. ? This includes having regularly scheduled colonoscopies. ? Talk to your health care provider about when you need a colonoscopy. Contact a health care provider if:  You have new or worsening bleeding during a bowel movement.  You have new or increased blood in your stool.  You have a change in bowel habits.  You lose weight for no known reason. Summary  Polyps are tissue growths inside the body. Polyps can grow in many places, including the colon.  Most colon polyps are noncancerous (benign), but some can become cancerous over time.  This condition is diagnosed with a colonoscopy.  Treatment for this condition involves removing any polyps that are found. Most polyps can be removed during a colonoscopy. This information is not intended to replace advice given to you by your health care provider.  Make sure you discuss any questions you have with your health care provider. Document Revised: 12/11/2017 Document Reviewed: 12/11/2017 Elsevier Patient Education  [place].

## 2020-07-07 NOTE — Transfer of Care (Signed)
Immediate Anesthesia Transfer of Care Note  Patient: William Washington  Procedure(s) Performed: COLONOSCOPY WITH PROPOFOL (N/A ) POLYPECTOMY  Patient Location: PACU  Anesthesia Type:General  Level of Consciousness: awake  Airway & Oxygen Therapy: Patient Spontanous Breathing  Post-op Assessment: Report given to RN  Post vital signs: Reviewed and stable  Last Vitals:  Vitals Value Taken Time  BP    Temp    Pulse    Resp    SpO2      Last Pain:  Vitals:   07/07/20 0849  TempSrc:   PainSc: 0-No pain      Patients Stated Pain Goal: 6 (99/35/70 1779)  Complications: No complications documented.

## 2020-07-07 NOTE — Op Note (Signed)
[facility] Patient Name: William Washington Procedure Date: 07/07/2020 7:08 AM MRN: [id number] Date of Birth: [birth year] Attending MD: Elon Alas. Edgar Frisk CSN: [id number] Age: 74 Admit Type: Outpatient Procedure:                Colonoscopy Indications:              Screening for colorectal malignant neoplasm Providers:                Elon Alas. Abbey Chatters, DO, Caprice Kluver, Casimer Bilis, Technician Referring MD:              Medicines:                See the Anesthesia note for documentation of the                            administered medications Complications:            No immediate complications. Estimated Blood Loss:     Estimated blood loss was minimal. Procedure:                Pre-Anesthesia Assessment:                           - The anesthesia plan was to use monitored                            anesthesia care (MAC).                           After obtaining informed consent, the colonoscope                            was passed under direct vision. Throughout the                            procedure, the patient's blood pressure, pulse, and                            oxygen saturations were monitored continuously. The                            [id number]-H190DL ([id number]) scope was introduced through                            the anus and advanced to the the cecum, identified                            by appendiceal orifice and ileocecal valve. The                            colonoscopy was performed without difficulty. The                            patient tolerated the procedure well. The  quality                            of the bowel preparation was evaluated using the                            BBPS [facility] Bowel Preparation Scale) with scores                            of: Right Colon = 2 (minor amount of residual                            staining, small fragments of stool and/or opaque                            liquid, but  mucosa seen well), Transverse Colon = 2                            (minor amount of residual staining, small fragments                            of stool and/or opaque liquid, but mucosa seen                            well) and Left Colon = 2 (minor amount of residual                            staining, small fragments of stool and/or opaque                            liquid, but mucosa seen well). The total BBPS score                            equals 6. The quality of the bowel preparation was                            fair. Scope In: 8:54:58 AM Scope Out: 9:14:19 AM Scope Withdrawal Time: 0 hours 16 minutes 41 seconds  Total Procedure Duration: 0 hours 19 minutes 21 seconds  Findings:      The perianal and digital rectal examinations were normal.      Non-bleeding internal hemorrhoids were found during endoscopy.      Multiple small-mouthed diverticula were found in the sigmoid colon and       descending colon.      Two sessile polyps were found in the ascending colon. The polyps were 3       to 5 mm in size. These polyps were removed with a cold snare. Resection       and retrieval were complete.      Two sessile polyps were found in the transverse colon. The polyps were 3       to 4 mm in size. These polyps were removed with a cold snare. Resection       and retrieval were complete.      The exam was otherwise without  abnormality. Impression:               - Preparation of the colon was fair.                           - Non-bleeding internal hemorrhoids.                           - Diverticulosis in the sigmoid colon and in the                            descending colon.                           - Two 3 to 5 mm polyps in the ascending colon,                            removed with a cold snare. Resected and retrieved.                           - Two 3 to 4 mm polyps in the transverse colon,                            removed with a cold snare. Resected and retrieved.                            - The examination was otherwise normal. Moderate Sedation:      Per Anesthesia Care Recommendation:           - Patient has a contact number available for                            emergencies. The signs and symptoms of potential                            delayed complications were discussed with the                            patient. Return to normal activities tomorrow.                            Written discharge instructions were provided to the                            patient.                           - Resume previous diet.                           - Continue present medications.                           - Await pathology results.                           -  Repeat colonoscopy in 5 years for surveillance.                           - Return to GI clinic PRN. Procedure Code(s):        --- Professional ---                           [id number], Colonoscopy, flexible; with removal of                            tumor(s), polyp(s), or other lesion(s) by snare                            technique Diagnosis Code(s):        --- Professional ---                           Z12.11, Encounter for screening for malignant                            neoplasm of colon                           K64.8, Other hemorrhoids                           K63.5, Polyp of colon                           K57.30, Diverticulosis of large intestine without                            perforation or abscess without bleeding CPT copyright 2019 [employer]. All rights reserved. The codes documented in this report are preliminary and upon coder review may  be revised to meet current compliance requirements. Elon Alas. Abbey Chatters, DO [place] Abbey Chatters, DO 07/07/2020 9:18:33 AM This report has been signed electronically. Number of Addenda: 0

## 2020-07-10 LAB — SURGICAL PATHOLOGY

## 2020-07-12 ENCOUNTER — Encounter (HOSPITAL_COMMUNITY): Payer: Self-pay | Admitting: Internal Medicine

## 2020-07-24 ENCOUNTER — Other Ambulatory Visit (HOSPITAL_COMMUNITY): Payer: Self-pay

## 2020-07-24 DIAGNOSIS — D509 Iron deficiency anemia, unspecified: Secondary | ICD-10-CM

## 2020-07-24 DIAGNOSIS — R718 Other abnormality of red blood cells: Secondary | ICD-10-CM

## 2020-07-25 ENCOUNTER — Inpatient Hospital Stay (HOSPITAL_COMMUNITY): Payer: Medicare HMO

## 2020-07-27 NOTE — Progress Notes (Signed)
Mr. Hayner  We have tried to contact you on several occasions. Please call our office at your convenience @ [phone].    Thank you, Floria Raveling

## 2020-07-31 ENCOUNTER — Inpatient Hospital Stay (HOSPITAL_COMMUNITY): Payer: Medicare HMO | Attending: Hematology

## 2020-07-31 ENCOUNTER — Other Ambulatory Visit: Payer: Self-pay

## 2020-07-31 DIAGNOSIS — E538 Deficiency of other specified B group vitamins: Secondary | ICD-10-CM | POA: Diagnosis not present

## 2020-07-31 DIAGNOSIS — E1121 Type 2 diabetes mellitus with diabetic nephropathy: Secondary | ICD-10-CM | POA: Diagnosis not present

## 2020-07-31 DIAGNOSIS — Z794 Long term (current) use of insulin: Secondary | ICD-10-CM | POA: Insufficient documentation

## 2020-07-31 DIAGNOSIS — N1831 Chronic kidney disease, stage 3a: Secondary | ICD-10-CM | POA: Diagnosis not present

## 2020-07-31 DIAGNOSIS — I129 Hypertensive chronic kidney disease with stage 1 through stage 4 chronic kidney disease, or unspecified chronic kidney disease: Secondary | ICD-10-CM | POA: Insufficient documentation

## 2020-07-31 DIAGNOSIS — D509 Iron deficiency anemia, unspecified: Secondary | ICD-10-CM | POA: Diagnosis not present

## 2020-07-31 DIAGNOSIS — N189 Chronic kidney disease, unspecified: Secondary | ICD-10-CM | POA: Insufficient documentation

## 2020-07-31 DIAGNOSIS — E1122 Type 2 diabetes mellitus with diabetic chronic kidney disease: Secondary | ICD-10-CM | POA: Diagnosis not present

## 2020-07-31 DIAGNOSIS — R718 Other abnormality of red blood cells: Secondary | ICD-10-CM

## 2020-07-31 LAB — CBC WITH DIFFERENTIAL/PLATELET
Abs Immature Granulocytes: 0.05 10*3/uL (ref 0.00–0.07)
Basophils Absolute: 0 10*3/uL (ref 0.0–0.1)
Basophils Relative: 1 %
Eosinophils Absolute: 0.2 10*3/uL (ref 0.0–0.5)
Eosinophils Relative: 3 %
HCT: 31.1 % — ABNORMAL LOW (ref 39.0–52.0)
Hemoglobin: 9.5 g/dL — ABNORMAL LOW (ref 13.0–17.0)
Immature Granulocytes: 1 %
Lymphocytes Relative: 22 %
Lymphs Abs: 1.5 10*3/uL (ref 0.7–4.0)
MCH: 20.3 pg — ABNORMAL LOW (ref 26.0–34.0)
MCHC: 30.5 g/dL (ref 30.0–36.0)
MCV: 66.6 fL — ABNORMAL LOW (ref 80.0–100.0)
Monocytes Absolute: 0.7 10*3/uL (ref 0.1–1.0)
Monocytes Relative: 10 %
Neutro Abs: 4.6 10*3/uL (ref 1.7–7.7)
Neutrophils Relative %: 63 %
Platelets: 180 10*3/uL (ref 150–400)
RBC: 4.67 MIL/uL (ref 4.22–5.81)
RDW: 17.3 % — ABNORMAL HIGH (ref 11.5–15.5)
WBC: 7.1 10*3/uL (ref 4.0–10.5)
nRBC: 0 % (ref 0.0–0.2)

## 2020-08-01 ENCOUNTER — Telehealth: Payer: Self-pay | Admitting: Internal Medicine

## 2020-08-01 ENCOUNTER — Ambulatory Visit (HOSPITAL_COMMUNITY): Payer: Medicare HMO | Admitting: Hematology

## 2020-08-01 NOTE — Telephone Encounter (Signed)
Returned pt call, advised of his colonoscopy results and to advised that next one should be scheduled in 5 years.

## 2020-08-01 NOTE — Telephone Encounter (Signed)
[phone] patient received letter we were trying to reach him, please call

## 2020-08-08 ENCOUNTER — Other Ambulatory Visit: Payer: Self-pay

## 2020-08-08 ENCOUNTER — Inpatient Hospital Stay (HOSPITAL_COMMUNITY): Payer: Medicare HMO | Admitting: Oncology

## 2020-08-08 VITALS — BP 134/70 | HR 98 | Temp 96.8°F | Resp 19 | Wt 208.3 lb

## 2020-08-08 DIAGNOSIS — Z79899 Other long term (current) drug therapy: Secondary | ICD-10-CM | POA: Diagnosis not present

## 2020-08-08 DIAGNOSIS — N1831 Chronic kidney disease, stage 3a: Secondary | ICD-10-CM | POA: Diagnosis not present

## 2020-08-08 DIAGNOSIS — I129 Hypertensive chronic kidney disease with stage 1 through stage 4 chronic kidney disease, or unspecified chronic kidney disease: Secondary | ICD-10-CM | POA: Diagnosis not present

## 2020-08-08 DIAGNOSIS — E538 Deficiency of other specified B group vitamins: Secondary | ICD-10-CM | POA: Diagnosis not present

## 2020-08-08 DIAGNOSIS — I10 Essential (primary) hypertension: Secondary | ICD-10-CM | POA: Diagnosis not present

## 2020-08-08 DIAGNOSIS — D509 Iron deficiency anemia, unspecified: Secondary | ICD-10-CM

## 2020-08-08 DIAGNOSIS — N183 Chronic kidney disease, stage 3 unspecified: Secondary | ICD-10-CM | POA: Diagnosis not present

## 2020-08-08 DIAGNOSIS — E1122 Type 2 diabetes mellitus with diabetic chronic kidney disease: Secondary | ICD-10-CM | POA: Diagnosis not present

## 2020-08-08 DIAGNOSIS — M1990 Unspecified osteoarthritis, unspecified site: Secondary | ICD-10-CM | POA: Diagnosis not present

## 2020-08-08 DIAGNOSIS — Z794 Long term (current) use of insulin: Secondary | ICD-10-CM | POA: Diagnosis not present

## 2020-08-08 DIAGNOSIS — N189 Chronic kidney disease, unspecified: Secondary | ICD-10-CM | POA: Diagnosis not present

## 2020-08-08 DIAGNOSIS — E118 Type 2 diabetes mellitus with unspecified complications: Secondary | ICD-10-CM | POA: Diagnosis not present

## 2020-08-08 DIAGNOSIS — E1121 Type 2 diabetes mellitus with diabetic nephropathy: Secondary | ICD-10-CM | POA: Diagnosis not present

## 2020-08-08 NOTE — Progress Notes (Signed)
CONSULT NOTE  Patient Care Team: Asencion Noble, MD as PCP - General (Internal Medicine) Gala Romney Cristopher Estimable, MD as Consulting Physician (Gastroenterology)  CHIEF COMPLAINTS/PURPOSE OF CONSULTATION: Iron deficiency anemia  HISTORY OF PRESENTING ILLNESS:  William Washington 74 y.o. male was sent here by Dr. Willey Blade his PCP for better management of iron deficiency anemia.  Patient reports he has been anemic since childhood.  He is of [place] descent and has a trait for thalassemia.  Patient reports he is trying to take oral iron therapy multiple times in the past and ends up very constipated.  He denies any bright red bleeding such as epistasis, hematuria or hematochezia.  His last colonoscopy was over 11 years ago where they saw polyps that were benign and hemorrhoids.  He denies a history of blood clots or blood transfusion.  He does have a history of CKD.  He also has obstructive sleep apnea where he wears a CPAP at night.  He denies any alcohol smoking or illicit drug use.  He reports his whole family is from [place] and all anemic.  He denies any pica and eats a variety of diet.  He has no prior history of diagnosis of cancer.  He has a family history positive for mother with pancreatic cancer and a daughter with breast cancer.  He lives at home alone and performs all his own ADLs.  Since his last visit, patient had a colonoscopy on 07/07/2020 which showed 4 polyps which were removed and nonbleeding internal hemorrhoids.  He has been doing well.  He denies any new concerns.  He has chronic shortness of breath and sleep problems.  His appetite and energy are fair.  MEDICAL HISTORY:  Past Medical History:  Diagnosis Date  . Diabetes mellitus without complication ([place])   . Hypercholesteremia   . Hypertension     SURGICAL HISTORY:   SOCIAL HISTORY: Social History   Socioeconomic History  . Marital status: Significant Other    Spouse name: Not on file  . Number of children: Not on file  .  Years of education: Not on file  . Highest education level: Not on file  Occupational History  . Not on file  Tobacco Use  . Smoking status: Never Smoker  . Smokeless tobacco: Never Used  Vaping Use  . Vaping Use: Never used  Substance and Sexual Activity  . Alcohol use: Yes    Comment: rare  . Drug use: Never  . Sexual activity: Not on file  Other Topics Concern  . Not on file  Social History Narrative  . Not on file   Social Determinants of Health   Financial Resource Strain: Low Risk   . Difficulty of Paying Living Expenses: Not hard at all  Food Insecurity: No Food Insecurity  . Worried About Charity fundraiser in the Last Year: Never true  . Ran Out of Food in the Last Year: Never true  Transportation Needs: No Transportation Needs  . Lack of Transportation (Medical): No  . Lack of Transportation (Non-Medical): No  Physical Activity: Inactive  . Days of Exercise per Week: 0 days  . Minutes of Exercise per Session: 0 min  Stress: No Stress Concern Present  . Feeling of Stress : Not at all  Social Connections: Moderately Isolated  . Frequency of Communication with Friends and Family: More than three times a week  . Frequency of Social Gatherings with Friends and Family: More than three times a week  . Attends Religious Services:  Never  . Active Member of Clubs or Organizations: No  . Attends Club or Organization Meetings: Never  . Marital Status: Living with partner  Intimate Partner Violence: Not At Risk  . Fear of Current or Ex-Partner: No  . Emotionally Abused: No  . Physically Abused: No  . Sexually Abused: No    FAMILY HISTORY: Family History  Problem Relation Age of Onset  . Severe sprains Neg Hx   . Scoliosis Neg Hx     ALLERGIES:  has No Known Allergies.  MEDICATIONS:  Current Outpatient Medications  Medication Sig Dispense Refill  . aspirin EC 81 MG tablet Take 81 mg by mouth daily. Swallow whole.    Marland Kitchen atorvastatin (LIPITOR) 40 MG tablet Take  40 mg by mouth at bedtime.     . Cholecalciferol (EQL VITAMIN D3) 50 MCG (2000 UT) CAPS Take 2,000 Units by mouth in the morning and at bedtime.     . iron dextran complex in sodium chloride 0.9 % 500 mL Inject into the vein once.    . loratadine-pseudoephedrine (CLARITIN-D 24-HOUR) 10-240 MG 24 hr tablet Take 1 tablet by mouth every other day.     . Omega-3 Fatty Acids (FISH OIL) 1000 MG CAPS Take 2,000 mg by mouth 2 (two) times daily.     Marland Kitchen oxymetazoline (AFRIN) 0.05 % nasal spray Place 1 spray into both nostrils daily as needed for congestion.    . pioglitazone (ACTOS) 30 MG tablet Take 30 mg by mouth daily.    . ramipril (ALTACE) 5 MG capsule Take 5 mg by mouth daily.    Tyler Aas FLEXTOUCH 100 UNIT/ML FlexTouch Pen     . Turmeric (QC TUMERIC COMPLEX) 500 MG CAPS Take 1,000 mg by mouth daily.     . vitamin B-12 (CYANOCOBALAMIN) 1000 MCG tablet Take 1,000 mcg by mouth daily.     No current facility-administered medications for this visit.    REVIEW OF SYSTEMS:   Review of Systems  Constitutional: Negative.  Negative for chills, fever, malaise/fatigue and weight loss.  HENT: Negative for congestion, ear pain and tinnitus.   Eyes: Negative.  Negative for blurred vision and double vision.  Respiratory: Positive for shortness of breath. Negative for cough and sputum production.   Cardiovascular: Negative.  Negative for chest pain, palpitations and leg swelling.  Gastrointestinal: Negative.  Negative for abdominal pain, constipation, diarrhea, nausea and vomiting.  Genitourinary: Negative for dysuria, frequency and urgency.  Musculoskeletal: Negative for back pain and falls.  Skin: Negative.  Negative for rash.  Neurological: Positive for headaches. Negative for weakness.  Endo/Heme/Allergies: Negative.  Does not bruise/bleed easily.  Psychiatric/Behavioral: Negative for depression. The patient has insomnia. The patient is not nervous/anxious.     PHYSICAL EXAMINATION: ECOG PERFORMANCE  STATUS: 1 - Symptomatic but completely ambulatory  Vitals:   08/08/20 0906  BP: 134/70  Pulse: 98  Resp: 19  Temp: (!) 96.8 F (36 C)  SpO2: 98%   Filed Weights   08/08/20 0906  Weight: 208 lb 4.8 oz (94.5 kg)    Physical Exam Constitutional:      Appearance: Normal appearance.  HENT:     Head: Normocephalic and atraumatic.  Eyes:     Pupils: Pupils are equal, round, and reactive to light.  Cardiovascular:     Rate and Rhythm: Normal rate and regular rhythm.     Heart sounds: Normal heart sounds. No murmur heard.   Pulmonary:     Effort: Pulmonary effort is normal.  Breath sounds: Normal breath sounds. No wheezing.  Abdominal:     General: Bowel sounds are normal. There is no distension.     Palpations: Abdomen is soft.     Tenderness: There is no abdominal tenderness.  Musculoskeletal:        General: Normal range of motion.     Cervical back: Normal range of motion.  Skin:    General: Skin is warm and dry.     Findings: No rash.  Neurological:     Mental Status: He is alert and oriented to person, place, and time.  Psychiatric:        Judgment: Judgment normal.     LABORATORY DATA:  I have reviewed the data as listed Recent Results (from the past 2160 hour(s))  Reticulocytes     Status: Abnormal   Collection Time: 05/31/20 12:11 PM  Result Value Ref Range   Retic Ct Pct 2.6 0.4 - 3.1 %   RBC. 4.60 4.22 - 5.81 MIL/uL   Retic Count, Absolute 121.4 19.0 - 186.0 K/uL   Immature Retic Fract 36.1 (H) 2.3 - 15.9 %    Comment: Performed at [facility], [address] [place], [place] [address]  Protein electrophoresis, serum     Status: None   Collection Time: 05/31/20 12:11 PM  Result Value Ref Range   Total Protein ELP 6.3 6.0 - 8.5 g/dL   Albumin ELP 3.5 2.9 - 4.4 g/dL   Alpha-1-Globulin 0.2 0.0 - 0.4 g/dL   Alpha-2-Globulin 0.6 0.4 - 1.0 g/dL   Beta Globulin 1.0 0.7 - 1.3 g/dL   Gamma Globulin 1.0 0.4 - 1.8 g/dL   M-Spike, % Not Observed Not  Observed g/dL   SPE Interp. Comment     Comment: (NOTE) The SPE pattern appears unremarkable. Evidence of monoclonal protein is not apparent. Performed At: [facility] [place], [place] [id number] Rush Farmer MD [id number]    Comment Comment     Comment: (NOTE) Protein electrophoresis scan will follow via computer, mail, or courier delivery.    Globulin, Total 2.8 2.2 - 3.9 g/dL   A/G Ratio 1.3 0.7 - 1.7  Folate     Status: None   Collection Time: 05/31/20 12:11 PM  Result Value Ref Range   Folate 8.5 >5.9 ng/mL    Comment: Performed at [facility], [address], [place], [place] [address]  Copper, serum     Status: None   Collection Time: 05/31/20 12:12 PM  Result Value Ref Range   Copper 99 69 - 132 ug/dL    Comment: (NOTE) This test was developed and its performance characteristics determined by [facility]. It has not been cleared or approved by the Food and Drug Administration.                                Detection Limit = 5 Performed At: [facility] [place], [place] [id number] Rush Farmer MD [id number]   Haptoglobin     Status: None   Collection Time: 05/31/20 12:12 PM  Result Value Ref Range   Haptoglobin 78 34 - 355 mg/dL    Comment: (NOTE) Performed At: [facility] [place], [place] [id number] Rush Farmer MD [id number]   VITAMIN D 25 Hydroxy (Vit-D Deficiency, Fractures)     Status: None   Collection Time: 05/31/20 12:12 PM  Result Value Ref Range   Vit D, 25-Hydroxy  40.82 30 - 100 ng/mL    Comment: (NOTE) Vitamin D deficiency has been defined by the [place] practice guideline as a level of serum 25-OH  vitamin D less than 20 ng/mL (1,2). The Endocrine Society went on to  further define vitamin D insufficiency as a level between 21 and 29  ng/mL (2).  1. IOM (Institute of Medicine). 2010. Dietary reference intakes for  calcium  and D. [place]: The [employer]. 2. Holick MF, Binkley [place], Bischoff-Ferrari HA, et al. Evaluation,  treatment, and prevention of vitamin D deficiency: an Endocrine  Society clinical practice guideline, JCEM. 2011 Jul; 96(7): 1911-30.  Performed at [facility], [place] [address], [place], [place] [address]   Vitamin B12     Status: Abnormal   Collection Time: 05/31/20 12:12 PM  Result Value Ref Range   Vitamin B-12 1,040 (H) 180 - 914 pg/mL    Comment: (NOTE) This assay is not validated for testing neonatal or myeloproliferative syndrome specimens for Vitamin B12 levels. Performed at [facility], [address] [place], [place] [address]   Methylmalonic acid, serum     Status: None   Collection Time: 05/31/20 12:12 PM  Result Value Ref Range   Methylmalonic Acid, Quantitative 207 0 - 378 nmol/L   Disclaimer: Comment     Comment: (NOTE) This test was developed and its performance characteristics determined by [facility]. It has not been cleared or approved by the Food and Drug Administration. Performed At: [facility] [place], [place] [id number] Rush Farmer MD [id number]   Lactate dehydrogenase     Status: Abnormal   Collection Time: 05/31/20 12:12 PM  Result Value Ref Range   LDH 221 (H) 98 - 192 U/L    Comment: Performed at [facility], [address], [place], [place] [address]  Iron and TIBC     Status: Abnormal   Collection Time: 05/31/20 12:12 PM  Result Value Ref Range   Iron 62 45 - 182 ug/dL   TIBC 421 250 - 450 ug/dL   Saturation Ratios 15 (L) 17.9 - 39.5 %   UIBC 359 ug/dL    Comment: Performed at [facility], [address] [place], [place] [address]  Ferritin     Status: Abnormal   Collection Time: 05/31/20 12:12 PM  Result Value Ref Range   Ferritin 18 (L) 24 - 336 ng/mL    Comment: Performed at [facility], [address], [place], [place] [address]  CBC with Differential/Platelet     Status: Abnormal    Collection Time: 05/31/20 12:12 PM  Result Value Ref Range   WBC 6.0 4.0 - 10.5 K/uL   RBC 4.45 4.22 - 5.81 MIL/uL   Hemoglobin 9.1 (L) 13.0 - 17.0 g/dL   HCT 29.6 (L) 39 - 52 %   MCV 66.5 (L) 80.0 - 100.0 fL   MCH 20.4 (L) 26.0 - 34.0 pg   MCHC 30.7 30.0 - 36.0 g/dL   RDW 17.7 (H) 11.5 - 15.5 %   Platelets 190 150 - 400 K/uL   nRBC 0.0 0.0 - 0.2 %   Neutrophils Relative % 62 %   Neutro Abs 3.7 1.7 - 7.7 K/uL   Lymphocytes Relative 24 %   Lymphs Abs 1.5 0.7 - 4.0 K/uL   Monocytes Relative 9 %   Monocytes Absolute 0.6 0.1 - 1.0 K/uL   Eosinophils Relative 3 %   Eosinophils Absolute 0.2 0.0 -  0.5 K/uL   Basophils Relative 1 %   Basophils Absolute 0.0 0.0 - 0.1 K/uL   Immature Granulocytes 1 %   Abs Immature Granulocytes 0.04 0.00 - 0.07 K/uL   Polychromasia PRESENT     Comment: Performed at [facility], [address] [place], [place] [address]  SARS CORONAVIRUS 2 (TAT 6-24 HRS) Nasopharyngeal Nasopharyngeal Swab     Status: None   Collection Time: 07/05/20  3:05 PM   Specimen: Nasopharyngeal Swab  Result Value Ref Range   SARS Coronavirus 2 NEGATIVE NEGATIVE    Comment: (NOTE) SARS-CoV-2 target nucleic acids are NOT DETECTED.  The SARS-CoV-2 RNA is generally detectable in upper and lower respiratory specimens during the acute phase of infection. Negative results do not preclude SARS-CoV-2 infection, do not rule out co-infections with other pathogens, and should not be used as the sole basis for treatment or other patient management decisions. Negative results must be combined with clinical observations, patient history, and epidemiological information. The expected result is Negative.  Fact Sheet for Patients: SugarRoll.be  Fact Sheet for Healthcare Providers: https://www.woods-mathews.com/  This test is not yet approved or cleared by the [place] FDA and  has been authorized for detection and/or diagnosis of SARS-CoV-2  by FDA under an Emergency Use Authorization (EUA). This EUA will remain  in effect (meaning this test can be used) for the duration of the COVID-19 declaration under Se ction 564(b)(1) of the Act, 21 U.S.C. section 360bbb-3(b)(1), unless the authorization is terminated or revoked sooner.  Performed at [facility], [place] [address] [place], [place] [address]   Glucose, capillary     Status: Abnormal   Collection Time: 07/07/20  7:39 AM  Result Value Ref Range   Glucose-Capillary 102 (H) 70 - 99 mg/dL    Comment: Glucose reference range applies only to samples taken after fasting for at least 8 hours.  Surgical pathology     Status: None   Collection Time: 07/07/20  8:59 AM  Result Value Ref Range   SURGICAL PATHOLOGY      SURGICAL PATHOLOGY CASE: [id number] PATIENT: Sherlyn Hay Surgical Pathology Report     Clinical History: screening     FINAL MICROSCOPIC DIAGNOSIS:  A. COLON, ASCENDING, TRANSVERSE, POLYPECTOMY: - Tubular adenoma(s) without high-grade dysplasia or malignancy - Inflammatory polyp      GROSS DESCRIPTION:  Received in formalin are tan, soft tissue fragments that are submitted in toto. Number: 4 size: 0.3-0.7 cm blocks: 1 (GRP 07/07/2020)    Final Diagnosis performed by Jaquita Folds, MD.   Electronically signed 07/10/2020 Technical component performed at [facility], [place] [address], [place], [place] [address].  Professional component performed at [facility], [place] [address] [place], [place] [address].  Immunohistochemistry Technical component (if applicable) was performed at [facility]. [address] [place], [place], [place] [address].   IMMUNOHISTOCHEMISTRY DISCLAIMER (if applic able): Some of these immunohistochemical stains may have been developed and the performance characteristics determine by [facility]. Some may not have been cleared or approved  by the U.S. Food and Drug Administration. The FDA has determined that such clearance or approval is not necessary. This test is used for clinical purposes. It should not be regarded as investigational or for research. This laboratory is certified under the [place] (CLIA-88) as qualified to perform high complexity clinical laboratory testing.  The controls stained appropriately.   CBC with Differential/Platelet  Status: Abnormal   Collection Time: 07/31/20 11:22 AM  Result Value Ref Range   WBC 7.1 4.0 - 10.5 K/uL   RBC 4.67 4.22 - 5.81 MIL/uL   Hemoglobin 9.5 (L) 13.0 - 17.0 g/dL   HCT 31.1 (L) 39 - 52 %   MCV 66.6 (L) 80.0 - 100.0 fL   MCH 20.3 (L) 26.0 - 34.0 pg   MCHC 30.5 30.0 - 36.0 g/dL   RDW 17.3 (H) 11.5 - 15.5 %   Platelets 180 150 - 400 K/uL   nRBC 0.0 0.0 - 0.2 %   Neutrophils Relative % 63 %   Neutro Abs 4.6 1.7 - 7.7 K/uL   Lymphocytes Relative 22 %   Lymphs Abs 1.5 0.7 - 4.0 K/uL   Monocytes Relative 10 %   Monocytes Absolute 0.7 0.1 - 1.0 K/uL   Eosinophils Relative 3 %   Eosinophils Absolute 0.2 0.0 - 0.5 K/uL   Basophils Relative 1 %   Basophils Absolute 0.0 0.0 - 0.1 K/uL   Immature Granulocytes 1 %   Abs Immature Granulocytes 0.05 0.00 - 0.07 K/uL    Comment: Performed at [facility], [address] [place], [place] [address]    RADIOGRAPHIC STUDIES: I have personally reviewed the radiological images as listed and agreed with the findings in the report. I have independently interviewed and examined this patient.  I agree with H&P written by my nurse practitioner Wenda Low, FNP.  I have independently formulated my assessment and plan.  ASSESSMENT & PLAN:  1.  Microcytic anemia: -Patient seen at the request of Dr. Willey Blade.  CBC on 05/10/2020 showed hemoglobin 9.9 with MCV of 64.  White count and platelets were normal.  Vitamin B12 was 312. -CBC on 02/03/2020 with hemoglobin 9.3, hematocrit 32, platelet count  306. -He has a history of thalassemia trait.  He has [place] ancestry.  Most of his family members reportedly have thalassemia trait. -He denies any history of blood transfusion.  Family history with mother with pancreatic cancer and daughter with breast cancer.  -Colonoscopy from October 2021 showed some internal nonbleeding hemorrhoids and for polyps that were removed and sent for testing.  Pathology was negative. -Denies any bleeding per rectum or melena. -Cannot tolerate iron pills secondary to severe constipation. -Labs from 05/31/2020 were fairly unremarkable.  His ferritin was 18, saturation ratio 15% with a hemoglobin of 9.1.  No other vitamin deficiencies, no hemolysis and SPEP was negative.  LDH was slightly elevated at 221. -He was given 2 doses of IV Feraheme on 06/02/2020 and 06/12/2020.  -Lab work from 07/31/2020 shows a slight improvement of his hemoglobin to 9.5.  -Patient has not noticed a difference in how he feels since his iron infusion.  2.  CKD: -He has chronic kidney disease with creatinine around 1.5.  3.  B12 deficiency: -B12 level was low at 217 on 02/03/2020. -He has been taking B12 1 mg tablet daily since then. -B12 levels have improved and is 1040.   4.  Diabetes: -Continue Tresiba and pioglitazone.  Disposition: -RTC in 3 to 4 months for repeat labs (CBC, CMP, vitamin B12, LDH, Iron panel, Ferritin)  All questions were answered. The patient knows to call the clinic with any problems, questions or concerns.      Jacquelin Hawking, NP 08/08/20 9:40 AM

## 2020-08-11 ENCOUNTER — Ambulatory Visit (HOSPITAL_COMMUNITY): Payer: Medicare HMO | Admitting: Oncology

## 2020-08-15 DIAGNOSIS — N183 Chronic kidney disease, stage 3 unspecified: Secondary | ICD-10-CM | POA: Diagnosis not present

## 2020-08-15 DIAGNOSIS — E1122 Type 2 diabetes mellitus with diabetic chronic kidney disease: Secondary | ICD-10-CM | POA: Diagnosis not present

## 2020-08-16 ENCOUNTER — Other Ambulatory Visit (HOSPITAL_COMMUNITY): Payer: Self-pay

## 2020-08-16 DIAGNOSIS — D509 Iron deficiency anemia, unspecified: Secondary | ICD-10-CM

## 2020-08-17 ENCOUNTER — Inpatient Hospital Stay (HOSPITAL_COMMUNITY): Payer: Medicare HMO | Attending: Hematology and Oncology

## 2020-09-26 DIAGNOSIS — H40033 Anatomical narrow angle, bilateral: Secondary | ICD-10-CM | POA: Diagnosis not present

## 2020-09-26 DIAGNOSIS — H40023 Open angle with borderline findings, high risk, bilateral: Secondary | ICD-10-CM | POA: Diagnosis not present

## 2020-09-26 DIAGNOSIS — E119 Type 2 diabetes mellitus without complications: Secondary | ICD-10-CM | POA: Diagnosis not present

## 2020-09-26 DIAGNOSIS — H16223 Keratoconjunctivitis sicca, not specified as Sjogren's, bilateral: Secondary | ICD-10-CM | POA: Diagnosis not present

## 2020-09-26 DIAGNOSIS — H25813 Combined forms of age-related cataract, bilateral: Secondary | ICD-10-CM | POA: Diagnosis not present

## 2020-09-26 DIAGNOSIS — Z794 Long term (current) use of insulin: Secondary | ICD-10-CM | POA: Diagnosis not present

## 2020-09-26 DIAGNOSIS — H524 Presbyopia: Secondary | ICD-10-CM | POA: Diagnosis not present

## 2020-11-14 ENCOUNTER — Ambulatory Visit (HOSPITAL_COMMUNITY): Payer: Medicare HMO | Admitting: Oncology

## 2020-11-29 DIAGNOSIS — H52223 Regular astigmatism, bilateral: Secondary | ICD-10-CM | POA: Diagnosis not present

## 2020-11-29 DIAGNOSIS — H524 Presbyopia: Secondary | ICD-10-CM | POA: Diagnosis not present

## 2020-12-01 DIAGNOSIS — D509 Iron deficiency anemia, unspecified: Secondary | ICD-10-CM | POA: Diagnosis not present

## 2020-12-01 DIAGNOSIS — I10 Essential (primary) hypertension: Secondary | ICD-10-CM | POA: Diagnosis not present

## 2020-12-01 DIAGNOSIS — Z79899 Other long term (current) drug therapy: Secondary | ICD-10-CM | POA: Diagnosis not present

## 2020-12-01 DIAGNOSIS — E1129 Type 2 diabetes mellitus with other diabetic kidney complication: Secondary | ICD-10-CM | POA: Diagnosis not present

## 2020-12-02 LAB — LAB REPORT - SCANNED
A1c: 7
EGFR: 79

## 2020-12-07 DIAGNOSIS — E1129 Type 2 diabetes mellitus with other diabetic kidney complication: Secondary | ICD-10-CM | POA: Diagnosis not present

## 2020-12-07 DIAGNOSIS — E1122 Type 2 diabetes mellitus with diabetic chronic kidney disease: Secondary | ICD-10-CM | POA: Diagnosis not present

## 2020-12-07 DIAGNOSIS — N183 Chronic kidney disease, stage 3 unspecified: Secondary | ICD-10-CM | POA: Diagnosis not present

## 2020-12-07 DIAGNOSIS — I10 Essential (primary) hypertension: Secondary | ICD-10-CM | POA: Diagnosis not present

## 2020-12-07 DIAGNOSIS — D509 Iron deficiency anemia, unspecified: Secondary | ICD-10-CM | POA: Diagnosis not present

## 2020-12-07 DIAGNOSIS — E730 Congenital lactase deficiency: Secondary | ICD-10-CM | POA: Diagnosis not present

## 2020-12-27 ENCOUNTER — Encounter: Payer: Self-pay | Admitting: Internal Medicine

## 2021-02-06 ENCOUNTER — Ambulatory Visit: Payer: Medicare HMO | Admitting: Urology

## 2021-02-06 ENCOUNTER — Encounter: Payer: Self-pay | Admitting: Urology

## 2021-02-06 ENCOUNTER — Other Ambulatory Visit: Payer: Self-pay

## 2021-02-06 VITALS — BP 150/90 | HR 92

## 2021-02-06 DIAGNOSIS — N4889 Other specified disorders of penis: Secondary | ICD-10-CM | POA: Diagnosis not present

## 2021-02-06 DIAGNOSIS — R3129 Other microscopic hematuria: Secondary | ICD-10-CM | POA: Diagnosis not present

## 2021-02-06 DIAGNOSIS — R350 Frequency of micturition: Secondary | ICD-10-CM

## 2021-02-06 DIAGNOSIS — N401 Enlarged prostate with lower urinary tract symptoms: Secondary | ICD-10-CM

## 2021-02-06 DIAGNOSIS — Z79899 Other long term (current) drug therapy: Secondary | ICD-10-CM | POA: Diagnosis not present

## 2021-02-06 DIAGNOSIS — Z125 Encounter for screening for malignant neoplasm of prostate: Secondary | ICD-10-CM | POA: Diagnosis not present

## 2021-02-06 DIAGNOSIS — D509 Iron deficiency anemia, unspecified: Secondary | ICD-10-CM | POA: Diagnosis not present

## 2021-02-06 DIAGNOSIS — N1832 Chronic kidney disease, stage 3b: Secondary | ICD-10-CM | POA: Diagnosis not present

## 2021-02-06 DIAGNOSIS — E1129 Type 2 diabetes mellitus with other diabetic kidney complication: Secondary | ICD-10-CM | POA: Diagnosis not present

## 2021-02-06 DIAGNOSIS — I10 Essential (primary) hypertension: Secondary | ICD-10-CM | POA: Diagnosis not present

## 2021-02-06 LAB — URINALYSIS, ROUTINE W REFLEX MICROSCOPIC
Bilirubin, UA: NEGATIVE
Glucose, UA: NEGATIVE
Ketones, UA: NEGATIVE
Leukocytes,UA: NEGATIVE
Nitrite, UA: NEGATIVE
Specific Gravity, UA: 1.02 (ref 1.005–1.030)
Urobilinogen, Ur: 0.2 mg/dL (ref 0.2–1.0)
pH, UA: 5.5 (ref 5.0–7.5)

## 2021-02-06 LAB — MICROSCOPIC EXAMINATION
Bacteria, UA: NONE SEEN
Renal Epithel, UA: NONE SEEN /hpf
WBC, UA: NONE SEEN /hpf (ref 0–5)

## 2021-02-06 NOTE — Progress Notes (Signed)
Urological Symptom Review  Patient is experiencing the following symptoms: Frequent urination Hard to postpone urination Get up at night to urinate Leakage of urine Erection problems (male only)  Penile pain   Review of Systems  Gastrointestinal (upper)  : Negative for upper GI symptoms  Gastrointestinal (lower) : Negative for lower GI symptoms  Constitutional : Negative for symptoms  Skin: Negative for skin symptoms  Eyes: Negative for eye symptoms  Ear/Nose/Throat : Sinus problems  Hematologic/Lymphatic: Negative for Hematologic/Lymphatic symptoms  Cardiovascular : Leg swelling  Respiratory : Shortness of breath  Endocrine: Excessive thirst  Musculoskeletal: Back pain Joint pain  Neurological: Dizziness  Psychologic: Negative for psychiatric symptoms

## 2021-02-06 NOTE — Progress Notes (Deleted)
H&P  Chief Complaint: ***  History of Present Illness: William Washington is a 75 y.o. year old male ***  Past Medical History:  Diagnosis Date  . Diabetes mellitus without complication ([place])   . Hypercholesteremia   . Hypertension     Past Surgical History:  Procedure Laterality Date  . COLONOSCOPY WITH PROPOFOL N/A 07/07/2020   Procedure: COLONOSCOPY WITH PROPOFOL;  Surgeon: Eloise Harman, DO;  [facility];  Service: Endoscopy;  Laterality: N/A;  9:00  . MENISCECTOMY Left   . ORIF FEMUR FRACTURE Left   . POLYPECTOMY  07/07/2020   Procedure: POLYPECTOMY;  Surgeon: Eloise Harman, DO;  Location: [facility];  Service: Endoscopy;;    Home Medications:  (Not in a hospital admission)   Allergies: No Known Allergies  Family History  Problem Relation Age of Onset  . Severe sprains Neg Hx   . Scoliosis Neg Hx     Social History:  reports that he has never smoked. He has never used smokeless tobacco. He reports current alcohol use. He reports that he does not use drugs.  ROS: A complete review of systems was performed.  All systems are negative except for pertinent findings as noted.  Physical Exam:  Vital signs in last 24 hours: @VSRANGES @ General:  Alert and oriented, No acute distress HEENT: Normocephalic, atraumatic Neck: No JVD or lymphadenopathy Cardiovascular: Regular rate  Lungs: Normal inspiratory/expiratory excursion Abdomen: Soft, nontender, nondistended, no abdominal masses Back: No CVA tenderness Extremities: No edema Neurologic: Grossly intact   I have reviewed prior pt notes  I have reviewed notes from referring/previous physicians  I have reviewed urinalysis results    Impression/Assessment:  ***  Plan:  William Washington William Washington 02/06/2021, 8:46 AM  William Washington. William Wombles MD

## 2021-02-06 NOTE — Progress Notes (Signed)
H&P  Chief Complaint: Penile discomfort, lower urinary tract symptoms  History of Present Illness: 75 year old male, native of [place] sent by Dr. Willey Blade for evaluation and management of penile discomfort, a feeling of penile shrinkage as well as urinary frequency and urgency.  He states that for several years his penis has been getting smaller.  He feels like it is retracting and it is somewhat discomfort.  Discomfort happens day and night.  He is circumcised.  He denies having ever had issues with infections of his penile skin.  He does have urinary frequency, urgency and intermittency.  Usually has a good stream.  IPSS 13, quality-of-life score 4.  IPSS Questionnaire (AUA-7): Over the past month.   1)  How often have you had a sensation of not emptying your bladder completely after you finish urinating?  1 - Less than 1 time in 5  2)  How often have you had to urinate again less than two hours after you finished urinating? 3 - About half the time  3)  How often have you found you stopped and started again several times when you urinated?  3 - About half the time  4) How difficult have you found it to postpone urination?  3 - About half the time  5) How often have you had a weak urinary stream?  1 - Less than 1 time in 5  6) How often have you had to push or strain to begin urination?  0 - Not at all  7) How many times did you most typically get up to urinate from the time you went to bed until the time you got up in the morning?  2 - 2 times  Total score:  0-7 mildly symptomatic   8-19 moderately symptomatic   20-35 severely symptomatic     Past Medical History:  Diagnosis Date  . Diabetes mellitus without complication ([place])   . Hypercholesteremia   . Hypertension     Past Surgical History:  Procedure Laterality Date  . COLONOSCOPY WITH PROPOFOL N/A 07/07/2020   Procedure: COLONOSCOPY WITH PROPOFOL;  Surgeon: Eloise Harman, DO;  [facility];  Service:  Endoscopy;  Laterality: N/A;  9:00  . MENISCECTOMY Left   . ORIF FEMUR FRACTURE Left   . POLYPECTOMY  07/07/2020   Procedure: POLYPECTOMY;  Surgeon: Eloise Harman, DO;  [facility];  Service: Endoscopy;;    Home Medications:  Allergies as of 02/06/2021   No Known Allergies     Medication List       Accurate as of Feb 06, 2021  2:30 PM. If you have any questions, ask your nurse or doctor.        STOP taking these medications   iron dextran complex in sodium chloride 0.9 % 500 mL Stopped by: Jorja Loa, MD     TAKE these medications   aspirin EC 81 MG tablet Take 81 mg by mouth daily. Swallow whole.   atorvastatin 40 MG tablet Commonly known as: LIPITOR Take 40 mg by mouth at bedtime.   EQL Vitamin D3 50 MCG (2000 UT) Caps Generic drug: Cholecalciferol Take 2,000 Units by mouth in the morning and at bedtime.   Fish Oil 1000 MG Caps Take 2,000 mg by mouth 2 (two) times daily.   loratadine-pseudoephedrine 10-240 MG 24 hr tablet Commonly known as: CLARITIN-D 24-hour Take 1 tablet by mouth every other day.   oxymetazoline 0.05 % nasal spray Commonly known as:  AFRIN Place 1 spray into both nostrils daily as needed for congestion.   pioglitazone 30 MG tablet Commonly known as: ACTOS Take 30 mg by mouth daily.   ramipril 5 MG capsule Commonly known as: ALTACE Take 5 mg by mouth daily.   Tyler Aas FlexTouch 100 UNIT/ML FlexTouch Pen Generic drug: insulin degludec   Turmeric 500 MG Caps Take 1,000 mg by mouth daily.   vitamin B-12 1000 MCG tablet Commonly known as: CYANOCOBALAMIN Take 1,000 mcg by mouth daily.       Allergies: No Known Allergies  Family History  Problem Relation Age of Onset  . Severe sprains Neg Hx   . Scoliosis Neg Hx     Social History:  reports that he has never smoked. He has never used smokeless tobacco. He reports current alcohol use. He reports that he does not use drugs.  ROS: A complete review of  systems was performed.  All systems are negative except for pertinent findings as noted.  Physical Exam:  Vital signs in last 24 hours: BP (!) 150/90   Pulse 92  Constitutional:  Alert and oriented, No acute distress Cardiovascular: Regular rate  Respiratory: Normal respiratory effort GI: Abdomen is obese, soft, nontender, nondistended, no abdominal masses. No CVAT.  No inguinal hernias noted. Genitourinary: Normal male phallus, buried in suprapubic fat.  About 2 cm is visible.  Testes are descended bilaterally and non-tender and without masses, scrotum is normal in appearance without lesions or masses, perineum is normal on inspection.  Normal anal sphincter tone.  No rectal masses.  Prostate 40 mL.  Slight firmness right greater than left lobe.  No discrete nodularity.  No tenderness. Lymphatic: No lymphadenopathy Neurologic: Grossly intact, no focal deficits Psychiatric: Normal mood and affect I have reviewed prior pt notes  I have reviewed notes from referring/previous physicians  I have reviewed urinalysis results--trace hematuria  No old PSA data is available  Impression/Assessment:  1.  Buried penis, patient is obese  2.  Mild microscopic hematuria, needs follow-up  3.  Penile discomfort, unknown cause  4.  Need for prostate cancer screening  5.  BPH with lower urinary tract symptoms  Plan:  1.  I will have PSA checked today  2.  I did discuss the fact that his buried penis is from obesity  3.  I gave him an overactive bladder guide sheet  4.  I will have him come back in a couple of months to recheck his symptoms

## 2021-02-07 LAB — LAB REPORT - SCANNED
A1c: 6.5
Albumin, Urine POC: 188
Creatinine, POC: 82.4 mg/dL
EGFR: 67
Microalb Creat Ratio: 228

## 2021-02-07 LAB — PSA: Prostate Specific Ag, Serum: 2.7 ng/mL (ref 0.0–4.0)

## 2021-02-08 NOTE — Progress Notes (Signed)
Results sent via mail.

## 2021-02-14 ENCOUNTER — Ambulatory Visit: Payer: Medicare HMO | Admitting: Internal Medicine

## 2021-02-14 ENCOUNTER — Encounter: Payer: Self-pay | Admitting: Internal Medicine

## 2021-02-14 VITALS — BP 141/79 | HR 101 | Temp 97.0°F | Ht 65.0 in | Wt 209.8 lb

## 2021-02-14 DIAGNOSIS — R196 Halitosis: Secondary | ICD-10-CM

## 2021-02-14 DIAGNOSIS — D126 Benign neoplasm of colon, unspecified: Secondary | ICD-10-CM

## 2021-02-14 DIAGNOSIS — K219 Gastro-esophageal reflux disease without esophagitis: Secondary | ICD-10-CM

## 2021-02-14 MED ORDER — OMEPRAZOLE 20 MG PO CPDR: 20.0000 mg | DELAYED_RELEASE_CAPSULE | Freq: Two times a day (BID) | ORAL | 5 refills | Status: DC

## 2021-02-14 NOTE — Patient Instructions (Signed)
Your symptoms are consistent for acid reflux.  I am going to start you on a new medication called omeprazole.  I want you to take this 30 minutes for breakfast and 30 minutes before dinner for 8 to 12 weeks.  At that point you can decrease to once daily.  If your symptoms do not improve as expected, we may need to proceed with upper endoscopy to further evaluate.  Follow-up in 3 months.  At [facility] we value your feedback. You may receive a survey about your visit today. Please share your experience as we strive to create trusting relationships with our patients to provide genuine, compassionate, quality care.  We appreciate your understanding and patience as we review any laboratory studies, imaging, and other diagnostic tests that are ordered as we care for you. Our office policy is 5 business days for review of these results, and any emergent or urgent results are addressed in a timely manner for your best interest. If you do not hear from our office in 1 week, please contact us.   We also encourage the use of MyChart, which contains your medical information for your review as well. If you are not enrolled in this feature, an access code is on this after visit summary for your convenience. Thank you for allowing [place] to be involved in your care.  It was great to see you today!  I hope you have a great rest of your summer!!    Elon Alas. Abbey Chatters, D.O. Gastroenterology and Hepatology [facility]

## 2021-02-14 NOTE — Progress Notes (Signed)
Referring Provider: Asencion Noble, MD Primary Care Physician:  Asencion Noble, MD Primary GI:  Dr. Abbey Chatters  Chief Complaint  Patient presents with  . bad breath  . change in bowels    Solid BM's after eating    HPI:   William Washington is a 75 y.o. male who presents to clinic today for follow-up visit.  Underwent colonoscopy in October for screening purposes found to have multiple tubular adenomas with 5-year recall.  Patient presents today with new complaints of "bad breath."  States this is progressively worsened over the last 6 months.  Patient notes his wife complaining about this regularly.  Reports he went to see a dentist who did not believe this was related to his dentition, tooth decay, E TC patient also notes a sour taste in his mouth regularly.  No heartburn.  No dysphagia odynophagia.  Not currently on any antacid medications.  Also notes bowel movements after he eats.  States these are normal though he does have urgency at times depending on what he eats.  Past Medical History:  Diagnosis Date  . Diabetes mellitus without complication ([place])   . Hypercholesteremia   . Hypertension     Past Surgical History:  Procedure Laterality Date  . COLONOSCOPY WITH PROPOFOL N/A 07/07/2020   Procedure: COLONOSCOPY WITH PROPOFOL;  Surgeon: Eloise Harman, DO;  [facility];  Service: Endoscopy;  Laterality: N/A;  9:00  . MENISCECTOMY Left   . ORIF FEMUR FRACTURE Left   . POLYPECTOMY  07/07/2020   Procedure: POLYPECTOMY;  Surgeon: Eloise Harman, DO;  [facility];  Service: Endoscopy;;    Current Outpatient Medications  Medication Sig Dispense Refill  . aspirin EC 81 MG tablet Take 81 mg by mouth daily. Swallow whole.    Marland Kitchen atorvastatin (LIPITOR) 40 MG tablet Take 40 mg by mouth at bedtime.     . Cholecalciferol (EQL VITAMIN D3) 50 MCG (2000 UT) CAPS Take 2,000 Units by mouth in the morning and at bedtime.     Marland Kitchen loratadine-pseudoephedrine (CLARITIN-D 24-HOUR)  10-240 MG 24 hr tablet Take 1 tablet by mouth every other day.     . Omega-3 Fatty Acids (FISH OIL) 1000 MG CAPS Take 2,000 mg by mouth 2 (two) times daily.     Marland Kitchen oxymetazoline (AFRIN) 0.05 % nasal spray Place 1 spray into both nostrils daily as needed for congestion.    . pioglitazone (ACTOS) 30 MG tablet Take 30 mg by mouth daily.    . ramipril (ALTACE) 5 MG capsule Take 5 mg by mouth daily.    Tyler Aas FLEXTOUCH 100 UNIT/ML FlexTouch Pen Inject 30 Units into the skin at bedtime.    . Turmeric 500 MG CAPS Take 1,000 mg by mouth daily.     . vitamin B-12 (CYANOCOBALAMIN) 1000 MCG tablet Take 1,000 mcg by mouth daily.     No current facility-administered medications for this visit.    Allergies as of 02/14/2021  . (No Known Allergies)    Family History  Problem Relation Age of Onset  . Severe sprains Neg Hx   . Scoliosis Neg Hx     Social History   Socioeconomic History  . Marital status: Significant Other    Spouse name: Not on file  . Number of children: Not on file  . Years of education: Not on file  . Highest education level: Not on file  Occupational History  . Not on file  Tobacco Use  .  Smoking status: Never Smoker  . Smokeless tobacco: Never Used  Vaping Use  . Vaping Use: Never used  Substance and Sexual Activity  . Alcohol use: Yes    Comment: rare  . Drug use: Never  . Sexual activity: Not on file  Other Topics Concern  . Not on file  Social History Narrative  . Not on file   Social Determinants of Health   Financial Resource Strain: Low Risk   . Difficulty of Paying Living Expenses: Not hard at all  Food Insecurity: No Food Insecurity  . Worried About Charity fundraiser in the Last Year: Never true  . Ran Out of Food in the Last Year: Never true  Transportation Needs: No Transportation Needs  . Lack of Transportation (Medical): No  . Lack of Transportation (Non-Medical): No  Physical Activity: Inactive  . Days of Exercise per Week: 0 days  .  Minutes of Exercise per Session: 0 min  Stress: No Stress Concern Present  . Feeling of Stress : Not at all  Social Connections: Moderately Isolated  . Frequency of Communication with Friends and Family: More than three times a week  . Frequency of Social Gatherings with Friends and Family: More than three times a week  . Attends Religious Services: Never  . Active Member of Clubs or Organizations: No  . Attends Archivist Meetings: Never  . Marital Status: Living with partner    Subjective: Review of Systems  Constitutional: Negative for chills and fever.  HENT: Negative for congestion and hearing loss.   Eyes: Negative for blurred vision and double vision.  Respiratory: Negative for cough and shortness of breath.   Cardiovascular: Negative for chest pain and palpitations.  Gastrointestinal: Negative for abdominal pain, blood in stool, constipation, diarrhea, heartburn, melena and vomiting.  Genitourinary: Negative for dysuria and urgency.  Musculoskeletal: Negative for joint pain and myalgias.  Skin: Negative for itching and rash.  Neurological: Negative for dizziness and headaches.  Psychiatric/Behavioral: Negative for depression. The patient is not nervous/anxious.      Objective: BP (!) 141/79   Pulse (!) 101   Temp (!) 97 F (36.1 C)   Ht 5\' 5"  (1.651 m)   Wt 209 lb 12.8 oz (95.2 kg)   BMI 34.91 kg/m  Physical Exam Constitutional:      Appearance: Normal appearance.  HENT:     Head: Normocephalic and atraumatic.  Eyes:     Extraocular Movements: Extraocular movements intact.     Conjunctiva/sclera: Conjunctivae normal.  Cardiovascular:     Rate and Rhythm: Normal rate and regular rhythm.  Pulmonary:     Effort: Pulmonary effort is normal.     Breath sounds: Normal breath sounds.  Abdominal:     General: Bowel sounds are normal.     Palpations: Abdomen is soft.  Musculoskeletal:        General: Normal range of motion.     Cervical back: Normal  range of motion and neck supple.  Skin:    General: Skin is warm.  Neurological:     General: No focal deficit present.     Mental Status: He is alert and oriented to person, place, and time.  Psychiatric:        Mood and Affect: Mood normal.        Behavior: Behavior normal.      Assessment: *Acid reflux *Halitosis *Adenomatous colon polyp  Plan: Patient's symptoms consistent with acid reflux.  I will start him on omeprazole  20 mg twice daily for the next 8 to 12 weeks.  At that point he can decrease to once daily thereafter.  If symptoms or not improved, we may need to consider upper endoscopy to further evaluate.  Colonoscopy recall 2026 for surveillance purposes.  Follow-up in 3 months   02/14/2021 10:14 AM   Disclaimer: This note was dictated with voice recognition software. Similar sounding words can inadvertently be transcribed and may not be corrected upon review.

## 2021-04-01 NOTE — Progress Notes (Signed)
History of Present Illness: Here for follow-up of several GU complaints.  5.31.2022: Initial presentation for penile discomfort, feeling of penile shrinkage, urinary frequency.  The patient did have significant buried penis due to obesity.  He did have frequent urination and he was given an overactive bladder guide sheet.  PSA was checked, returned normal at 2.7.  He did have trace blood on dipstick.  7.26.2022: No gross hematuria.  Behavioral modification did help his lower urinary tract symptoms some.  His buried penis is not bothering him as much.  He would like to consider as needed medication for his overactive bladder symptoms.  Past Medical History:  Diagnosis Date   Diabetes mellitus without complication ([place])    Hypercholesteremia    Hypertension     Past Surgical History:  Procedure Laterality Date   COLONOSCOPY WITH PROPOFOL N/A 07/07/2020   Procedure: COLONOSCOPY WITH PROPOFOL;  Surgeon: Eloise Harman, DO;  [facility];  Service: Endoscopy;  Laterality: N/A;  9:00   MENISCECTOMY Left    ORIF FEMUR FRACTURE Left    POLYPECTOMY  07/07/2020   Procedure: POLYPECTOMY;  Surgeon: Eloise Harman, DO;  [facility];  Service: Endoscopy;;    Home Medications:  Allergies as of 04/03/2021   No Known Allergies      Medication List        Accurate as of April 01, 2021  1:58 PM. If you have any questions, ask your nurse or doctor.          aspirin EC 81 MG tablet Take 81 mg by mouth daily. Swallow whole.   atorvastatin 40 MG tablet Commonly known as: LIPITOR Take 40 mg by mouth at bedtime.   EQL Vitamin D3 50 MCG (2000 UT) Caps Generic drug: Cholecalciferol Take 2,000 Units by mouth in the morning and at bedtime.   Fish Oil 1000 MG Caps Take 2,000 mg by mouth 2 (two) times daily.   loratadine-pseudoephedrine 10-240 MG 24 hr tablet Commonly known as: CLARITIN-D 24-hour Take 1 tablet by mouth every other day.   omeprazole 20 MG  capsule Commonly known as: PRILOSEC Take 1 capsule (20 mg total) by mouth 2 (two) times daily before a meal.   oxymetazoline 0.05 % nasal spray Commonly known as: AFRIN Place 1 spray into both nostrils daily as needed for congestion.   pioglitazone 30 MG tablet Commonly known as: ACTOS Take 30 mg by mouth daily.   ramipril 5 MG capsule Commonly known as: ALTACE Take 5 mg by mouth daily.   Tyler Aas FlexTouch 100 UNIT/ML FlexTouch Pen Generic drug: insulin degludec Inject 30 Units into the skin at bedtime.   Turmeric 500 MG Caps Take 1,000 mg by mouth daily.   vitamin B-12 1000 MCG tablet Commonly known as: CYANOCOBALAMIN Take 1,000 mcg by mouth daily.        Allergies: No Known Allergies  Family History  Problem Relation Age of Onset   Severe sprains Neg Hx    Scoliosis Neg Hx     Social History:  reports that he has never smoked. He has never used smokeless tobacco. He reports current alcohol use. He reports that he does not use drugs.  ROS: A complete review of systems was performed.  All systems are negative except for pertinent findings as noted.  Physical Exam:  Vital signs in last 24 hours: There were no vitals taken for this visit. Constitutional:  Alert and oriented, No acute distress Cardiovascular: Regular rate  Respiratory: Normal respiratory  effort Neurologic: Grossly intact, no focal deficits Psychiatric: Normal mood and affect  I have reviewed prior pt notes  I have reviewed urinalysis results  I have reviewed prior PSA results    Impression/Assessment:  1.  Lower urinary tract symptoms-frequency, urgency.  Still fairly bothersome  2.  Buried penis  3.  Persistent microscopic hematuria  Plan:  1.  I have given him oxybutynin IR for as needed use  2.  I will draw BUN and creatinine in advance of hematuria CT.  This will be followed by office visit with cystoscopy

## 2021-04-03 ENCOUNTER — Ambulatory Visit: Payer: Medicare HMO | Admitting: Urology

## 2021-04-03 ENCOUNTER — Encounter: Payer: Self-pay | Admitting: Urology

## 2021-04-03 ENCOUNTER — Other Ambulatory Visit: Payer: Self-pay

## 2021-04-03 VITALS — BP 131/77 | HR 92

## 2021-04-03 DIAGNOSIS — N4889 Other specified disorders of penis: Secondary | ICD-10-CM

## 2021-04-03 DIAGNOSIS — N4883 Acquired buried penis: Secondary | ICD-10-CM | POA: Diagnosis not present

## 2021-04-03 DIAGNOSIS — R350 Frequency of micturition: Secondary | ICD-10-CM | POA: Diagnosis not present

## 2021-04-03 DIAGNOSIS — N401 Enlarged prostate with lower urinary tract symptoms: Secondary | ICD-10-CM

## 2021-04-03 DIAGNOSIS — R3129 Other microscopic hematuria: Secondary | ICD-10-CM

## 2021-04-03 LAB — URINALYSIS, ROUTINE W REFLEX MICROSCOPIC
Bilirubin, UA: NEGATIVE
Glucose, UA: NEGATIVE
Ketones, UA: NEGATIVE
Nitrite, UA: NEGATIVE
Specific Gravity, UA: 1.02 (ref 1.005–1.030)
Urobilinogen, Ur: 0.2 mg/dL (ref 0.2–1.0)
pH, UA: 5.5 (ref 5.0–7.5)

## 2021-04-03 LAB — MICROSCOPIC EXAMINATION: Renal Epithel, UA: NONE SEEN /hpf

## 2021-04-03 MED ORDER — OXYBUTYNIN CHLORIDE 5 MG PO TABS: 5.0000 mg | ORAL_TABLET | Freq: Three times a day (TID) | ORAL | 11 refills | Status: DC | PRN

## 2021-04-03 NOTE — Progress Notes (Signed)
Urological Symptom Review  Patient is experiencing the following symptoms: Frequent urination Hard to postpone urination Get up at night to urinate Erection problems (male only)   Review of Systems  Gastrointestinal (upper)  : Negative for upper GI symptoms  Gastrointestinal (lower) : Negative for lower GI symptoms  Constitutional : Negative for symptoms  Skin: Negative for skin symptoms  Eyes: Negative for eye symptoms  Ear/Nose/Throat : Sinus problems  Hematologic/Lymphatic: Negative for Hematologic/Lymphatic symptoms  Cardiovascular : Negative for cardiovascular symptoms  Respiratory : Cough  Endocrine: Negative for endocrine symptoms  Musculoskeletal: Back pain Joint pain  Neurological: Negative for neurological symptoms  Psychologic: Negative for psychiatric symptoms

## 2021-04-04 LAB — BUN+CREAT
BUN/Creatinine Ratio: 12 (ref 10–24)
BUN: 14 mg/dL (ref 8–27)
Creatinine, Ser: 1.15 mg/dL (ref 0.76–1.27)
eGFR: 67 mL/min/{1.73_m2} (ref >59)

## 2021-04-10 ENCOUNTER — Other Ambulatory Visit: Payer: Self-pay

## 2021-04-10 ENCOUNTER — Ambulatory Visit (HOSPITAL_COMMUNITY)
Admission: RE | Admit: 2021-04-10 | Discharge: 2021-04-10 | Disposition: A | Discharge status: Home / Self Care | Payer: Medicare HMO | Source: Ambulatory Visit | Attending: Urology | Admitting: Urology

## 2021-04-10 DIAGNOSIS — Q630 Accessory kidney: Secondary | ICD-10-CM | POA: Diagnosis not present

## 2021-04-10 DIAGNOSIS — K439 Ventral hernia without obstruction or gangrene: Secondary | ICD-10-CM | POA: Diagnosis not present

## 2021-04-10 DIAGNOSIS — R3129 Other microscopic hematuria: Secondary | ICD-10-CM | POA: Diagnosis not present

## 2021-04-10 DIAGNOSIS — N261 Atrophy of kidney (terminal): Secondary | ICD-10-CM | POA: Diagnosis not present

## 2021-04-10 DIAGNOSIS — K429 Umbilical hernia without obstruction or gangrene: Secondary | ICD-10-CM | POA: Diagnosis not present

## 2021-04-19 NOTE — Progress Notes (Signed)
Sent via mychart

## 2021-04-24 ENCOUNTER — Encounter: Payer: Self-pay | Admitting: Urology

## 2021-04-24 ENCOUNTER — Ambulatory Visit (INDEPENDENT_AMBULATORY_CARE_PROVIDER_SITE_OTHER): Payer: Medicare HMO | Admitting: Urology

## 2021-04-24 ENCOUNTER — Other Ambulatory Visit: Payer: Self-pay

## 2021-04-24 DIAGNOSIS — R3129 Other microscopic hematuria: Secondary | ICD-10-CM

## 2021-04-24 DIAGNOSIS — N201 Calculus of ureter: Secondary | ICD-10-CM

## 2021-04-24 LAB — URINALYSIS, ROUTINE W REFLEX MICROSCOPIC
Bilirubin, UA: NEGATIVE
Glucose, UA: NEGATIVE
Ketones, UA: NEGATIVE
Nitrite, UA: NEGATIVE
Specific Gravity, UA: 1.02 (ref 1.005–1.030)
Urobilinogen, Ur: 0.2 mg/dL (ref 0.2–1.0)
pH, UA: 5.5 (ref 5.0–7.5)

## 2021-04-24 LAB — MICROSCOPIC EXAMINATION
Bacteria, UA: NONE SEEN
Epithelial Cells (non renal): NONE SEEN /hpf (ref 0–10)
Renal Epithel, UA: NONE SEEN /hpf

## 2021-04-24 MED ORDER — CIPROFLOXACIN HCL 500 MG PO TABS
500.0000 mg | ORAL_TABLET | Freq: Once | ORAL | Status: AC
  Administered 2021-04-24: 500 mg via ORAL

## 2021-04-24 NOTE — Addendum Note (Signed)
Addended byIris Pert on: 04/24/2021 04:04 PM   Modules accepted: Orders

## 2021-04-24 NOTE — H&P (View-Only) (Signed)
History of Present Illness: For follow-up of microscopic hematuria and recently diagnosed right ureteral stone  5.31.2022: Initial presentation for penile discomfort, feeling of penile shrinkage, urinary frequency.  The patient did have significant buried penis due to obesity.  He did have frequent urination and he was given an overactive bladder guide sheet.  PSA was checked, returned normal at 2.7.  He did have trace blood on dipstick.  7.26.2022: No gross hematuria.  Behavioral modification did help his lower urinary tract symptoms some.  His buried penis is not bothering him as much.  He would like to consider as needed medication for his overactive bladder symptoms.  Was noted to have persistent microscopic hematuria.  8.2.2022: Hematuria CT: IMPRESSION: Moderate right hydronephrosis due to 8 mm proximal right ureteral calculus.   Bilateral nephrolithiasis.   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   Small umbilical ventral hernia containing only fat.   Aortic Atherosclerosis (ICD10-I70.0).  8.16.2022: Here for cystoscopy.    Past Medical History:  Diagnosis Date   Diabetes mellitus without complication ([place])    Hypercholesteremia    Hypertension     Past Surgical History:  Procedure Laterality Date   COLONOSCOPY WITH PROPOFOL N/A 07/07/2020   Procedure: COLONOSCOPY WITH PROPOFOL;  Surgeon: Eloise Harman, DO;  [facility];  Service: Endoscopy;  Laterality: N/A;  9:00   MENISCECTOMY Left    ORIF FEMUR FRACTURE Left    POLYPECTOMY  07/07/2020   Procedure: POLYPECTOMY;  Surgeon: Eloise Harman, DO;  [facility];  Service: Endoscopy;;    Home Medications:  Allergies as of 04/24/2021   No Known Allergies      Medication List        Accurate as of April 24, 2021  1:03 PM. If you have any questions, ask your nurse or doctor.          aspirin EC 81 MG  tablet Take 81 mg by mouth daily. Swallow whole.   atorvastatin 40 MG tablet Commonly known as: LIPITOR Take 40 mg by mouth at bedtime.   EQL Vitamin D3 50 MCG (2000 UT) Caps Generic drug: Cholecalciferol Take 2,000 Units by mouth in the morning and at bedtime.   Fish Oil 1000 MG Caps Take 2,000 mg by mouth 2 (two) times daily.   loratadine-pseudoephedrine 10-240 MG 24 hr tablet Commonly known as: CLARITIN-D 24-hour Take 1 tablet by mouth every other day.   omeprazole 20 MG capsule Commonly known as: PRILOSEC Take 1 capsule (20 mg total) by mouth 2 (two) times daily before a meal.   oxybutynin 5 MG tablet Commonly known as: DITROPAN Take 1 tablet (5 mg total) by mouth every 8 (eight) hours as needed for up to 15 doses (Urinary frequency/urgency).   oxymetazoline 0.05 % nasal spray Commonly known as: AFRIN Place 1 spray into both nostrils daily as needed for congestion.   pioglitazone 30 MG tablet Commonly known as: ACTOS Take 30 mg by mouth daily.   ramipril 5 MG capsule Commonly known as: ALTACE Take 5 mg by mouth daily.   Tyler Aas FlexTouch 100 UNIT/ML FlexTouch Pen Generic drug: insulin degludec Inject 30 Units into the skin at bedtime.   Turmeric 500 MG Caps Take 1,000 mg by mouth daily.   vitamin B-12 1000 MCG tablet Commonly known as: CYANOCOBALAMIN Take 1,000 mcg by mouth daily.        Allergies: No Known Allergies  Family History  Problem Relation Age of Onset   Severe sprains Neg Hx    Scoliosis Neg Hx     Social History:  reports that he has never smoked. He has never used smokeless tobacco. He reports current alcohol use. He reports that he does not use drugs.  ROS: A complete review of systems was performed.  All systems are negative except for pertinent findings as noted.  Physical Exam:  Vital signs in last 24 hours: There were no vitals taken for this visit. Constitutional:  Alert and oriented, No acute distress Cardiovascular:  Regular rate  Respiratory: Normal respiratory effort GI: Abdomen is soft, nontender, nondistended, no abdominal masses. No CVAT.  Genitourinary: Normal male phallus, testes are descended bilaterally and non-tender and without masses, scrotum is normal in appearance without lesions or masses, perineum is normal on inspection. Lymphatic: No lymphadenopathy Neurologic: Grossly intact, no focal deficits Psychiatric: Normal mood and affect  I have reviewed prior pt notes  I have reviewed notes from referring/previous physicians  I have reviewed urinalysis results  I have independently reviewed prior imaging  I have reviewed prior PSA results  I have reviewed prior urine culture  Cystoscopy Procedure Note:  Indication: Hematuria  After informed consent and discussion of the procedure and its risks, Namon Cranston was positioned and prepped in the standard fashion.  Cystoscopy was performed with a flexible cystoscope.   Findings: Urethra: Normal without stricture Prostate: Minimally obstructing Bladder neck: Normal Ureteral orifices: Normal Bladder: No urothelial lesions, no stones.  The patient tolerated the procedure well.    Impression/Assessment:  Right proximal ureteral stone.  Skin to stone distance 15 cm, Hounsfield unit ~ 1000  Plan:  I discussed the patient's ureteral stone with him.  I also discussed treatment option including ureteroscopy/laser/extraction/stent versus shockwave lithotripsy.  Success rates of both of these as well as complications discussed as well.  I have recommended, with his abdominal girth, that ureteroscopy would be more efficacious than shockwave lithotripsy.  The patient would prefer to have shockwave lithotripsy.  We will set up an appointment to have that done here in [place].

## 2021-04-24 NOTE — Progress Notes (Signed)
History of Present Illness: For follow-up of microscopic hematuria and recently diagnosed right ureteral stone  5.31.2022: Initial presentation for penile discomfort, feeling of penile shrinkage, urinary frequency.  The patient did have significant buried penis due to obesity.  He did have frequent urination and he was given an overactive bladder guide sheet.  PSA was checked, returned normal at 2.7.  He did have trace blood on dipstick.  7.26.2022: No gross hematuria.  Behavioral modification did help his lower urinary tract symptoms some.  His buried penis is not bothering him as much.  He would like to consider as needed medication for his overactive bladder symptoms.  Was noted to have persistent microscopic hematuria.  8.2.2022: Hematuria CT: IMPRESSION: Moderate right hydronephrosis due to 8 mm proximal right ureteral calculus.   Bilateral nephrolithiasis.   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   Small umbilical ventral hernia containing only fat.   Aortic Atherosclerosis (ICD10-I70.0).  8.16.2022: Here for cystoscopy.    Past Medical History:  Diagnosis Date   Diabetes mellitus without complication ([place])    Hypercholesteremia    Hypertension     Past Surgical History:  Procedure Laterality Date   COLONOSCOPY WITH PROPOFOL N/A 07/07/2020   Procedure: COLONOSCOPY WITH PROPOFOL;  Surgeon: Eloise Harman, DO;  [facility];  Service: Endoscopy;  Laterality: N/A;  9:00   MENISCECTOMY Left    ORIF FEMUR FRACTURE Left    POLYPECTOMY  07/07/2020   Procedure: POLYPECTOMY;  Surgeon: Eloise Harman, DO;  [facility];  Service: Endoscopy;;    Home Medications:  Allergies as of 04/24/2021   No Known Allergies      Medication List        Accurate as of April 24, 2021  1:03 PM. If you have any questions, ask your nurse or doctor.          aspirin EC 81 MG  tablet Take 81 mg by mouth daily. Swallow whole.   atorvastatin 40 MG tablet Commonly known as: LIPITOR Take 40 mg by mouth at bedtime.   EQL Vitamin D3 50 MCG (2000 UT) Caps Generic drug: Cholecalciferol Take 2,000 Units by mouth in the morning and at bedtime.   Fish Oil 1000 MG Caps Take 2,000 mg by mouth 2 (two) times daily.   loratadine-pseudoephedrine 10-240 MG 24 hr tablet Commonly known as: CLARITIN-D 24-hour Take 1 tablet by mouth every other day.   omeprazole 20 MG capsule Commonly known as: PRILOSEC Take 1 capsule (20 mg total) by mouth 2 (two) times daily before a meal.   oxybutynin 5 MG tablet Commonly known as: DITROPAN Take 1 tablet (5 mg total) by mouth every 8 (eight) hours as needed for up to 15 doses (Urinary frequency/urgency).   oxymetazoline 0.05 % nasal spray Commonly known as: AFRIN Place 1 spray into both nostrils daily as needed for congestion.   pioglitazone 30 MG tablet Commonly known as: ACTOS Take 30 mg by mouth daily.   ramipril 5 MG capsule Commonly known as: ALTACE Take 5 mg by mouth daily.   Tyler Aas FlexTouch 100 UNIT/ML FlexTouch Pen Generic drug: insulin degludec Inject 30 Units into the skin at bedtime.   Turmeric 500 MG Caps Take 1,000 mg by mouth daily.   vitamin B-12 1000 MCG tablet Commonly known as: CYANOCOBALAMIN Take 1,000 mcg by mouth daily.        Allergies: No Known Allergies  Family History  Problem Relation Age of Onset   Severe sprains Neg Hx    Scoliosis Neg Hx     Social History:  reports that he has never smoked. He has never used smokeless tobacco. He reports current alcohol use. He reports that he does not use drugs.  ROS: A complete review of systems was performed.  All systems are negative except for pertinent findings as noted.  Physical Exam:  Vital signs in last 24 hours: There were no vitals taken for this visit. Constitutional:  Alert and oriented, No acute distress Cardiovascular:  Regular rate  Respiratory: Normal respiratory effort GI: Abdomen is soft, nontender, nondistended, no abdominal masses. No CVAT.  Genitourinary: Normal male phallus, testes are descended bilaterally and non-tender and without masses, scrotum is normal in appearance without lesions or masses, perineum is normal on inspection. Lymphatic: No lymphadenopathy Neurologic: Grossly intact, no focal deficits Psychiatric: Normal mood and affect  I have reviewed prior pt notes  I have reviewed notes from referring/previous physicians  I have reviewed urinalysis results  I have independently reviewed prior imaging  I have reviewed prior PSA results  I have reviewed prior urine culture  Cystoscopy Procedure Note:  Indication: Hematuria  After informed consent and discussion of the procedure and its risks, William Washington was positioned and prepped in the standard fashion.  Cystoscopy was performed with a flexible cystoscope.   Findings: Urethra: Normal without stricture Prostate: Minimally obstructing Bladder neck: Normal Ureteral orifices: Normal Bladder: No urothelial lesions, no stones.  The patient tolerated the procedure well.    Impression/Assessment:  Right proximal ureteral stone.  Skin to stone distance 15 cm, Hounsfield unit ~ 1000  Plan:  I discussed the patient's ureteral stone with him.  I also discussed treatment option including ureteroscopy/laser/extraction/stent versus shockwave lithotripsy.  Success rates of both of these as well as complications discussed as well.  I have recommended, with his abdominal girth, that ureteroscopy would be more efficacious than shockwave lithotripsy.  The patient would prefer to have shockwave lithotripsy.  We will set up an appointment to have that done here in [place].

## 2021-04-24 NOTE — Progress Notes (Signed)
Urological Symptom Review  Patient is experiencing the following symptoms: Frequent urination Get up at night to urinate Erection problems (male only)   Review of Systems  Gastrointestinal (upper)  : Negative for upper GI symptoms  Gastrointestinal (lower) : Negative for lower GI symptoms  Constitutional : Negative for symptoms  Skin: Negative for skin symptoms  Eyes: Negative for eye symptoms  Ear/Nose/Throat : Sinus problems  Hematologic/Lymphatic: Negative for Hematologic/Lymphatic symptoms  Cardiovascular : Negative for cardiovascular symptoms  Respiratory : Negative for respiratory symptoms  Endocrine: Negative for endocrine symptoms  Musculoskeletal: Back pain Joint pain  Neurological: Negative for neurological symptoms  Psychologic: Negative for psychiatric symptoms

## 2021-05-01 DIAGNOSIS — I700 Atherosclerosis of aorta: Secondary | ICD-10-CM | POA: Diagnosis not present

## 2021-05-01 DIAGNOSIS — E1122 Type 2 diabetes mellitus with diabetic chronic kidney disease: Secondary | ICD-10-CM | POA: Diagnosis not present

## 2021-05-01 DIAGNOSIS — D500 Iron deficiency anemia secondary to blood loss (chronic): Secondary | ICD-10-CM | POA: Diagnosis not present

## 2021-05-02 ENCOUNTER — Encounter (HOSPITAL_COMMUNITY)
Admission: RE | Admit: 2021-05-02 | Discharge: 2021-05-02 | Disposition: A | Discharge status: Home / Self Care | Payer: Medicare HMO | Source: Ambulatory Visit | Attending: Urology | Admitting: Urology

## 2021-05-02 ENCOUNTER — Encounter (HOSPITAL_COMMUNITY): Payer: Self-pay

## 2021-05-02 ENCOUNTER — Other Ambulatory Visit: Payer: Self-pay

## 2021-05-02 HISTORY — DX: Sleep apnea, unspecified: G47.30

## 2021-05-08 ENCOUNTER — Ambulatory Visit (HOSPITAL_COMMUNITY)
Admission: RE | Admit: 2021-05-08 | Discharge: 2021-05-08 | Disposition: A | Discharge status: Home / Self Care | Payer: Medicare HMO | Attending: Urology | Admitting: Urology

## 2021-05-08 ENCOUNTER — Other Ambulatory Visit (HOSPITAL_COMMUNITY): Payer: Medicare HMO

## 2021-05-08 ENCOUNTER — Encounter (HOSPITAL_COMMUNITY): Payer: Self-pay | Admitting: Urology

## 2021-05-08 ENCOUNTER — Ambulatory Visit (HOSPITAL_COMMUNITY): Payer: Medicare HMO

## 2021-05-08 ENCOUNTER — Encounter (HOSPITAL_COMMUNITY)
Admission: RE | Disposition: A | Discharge status: Home / Self Care | Payer: Self-pay | Source: Home / Self Care | Attending: Urology

## 2021-05-08 DIAGNOSIS — N201 Calculus of ureter: Secondary | ICD-10-CM | POA: Diagnosis not present

## 2021-05-08 DIAGNOSIS — N200 Calculus of kidney: Secondary | ICD-10-CM | POA: Diagnosis not present

## 2021-05-08 DIAGNOSIS — N3281 Overactive bladder: Secondary | ICD-10-CM | POA: Insufficient documentation

## 2021-05-08 DIAGNOSIS — E119 Type 2 diabetes mellitus without complications: Secondary | ICD-10-CM | POA: Diagnosis not present

## 2021-05-08 DIAGNOSIS — Q625 Duplication of ureter: Secondary | ICD-10-CM | POA: Insufficient documentation

## 2021-05-08 DIAGNOSIS — Z7982 Long term (current) use of aspirin: Secondary | ICD-10-CM | POA: Diagnosis not present

## 2021-05-08 DIAGNOSIS — I700 Atherosclerosis of aorta: Secondary | ICD-10-CM | POA: Insufficient documentation

## 2021-05-08 DIAGNOSIS — I10 Essential (primary) hypertension: Secondary | ICD-10-CM | POA: Diagnosis not present

## 2021-05-08 DIAGNOSIS — N132 Hydronephrosis with renal and ureteral calculous obstruction: Secondary | ICD-10-CM | POA: Diagnosis not present

## 2021-05-08 DIAGNOSIS — K429 Umbilical hernia without obstruction or gangrene: Secondary | ICD-10-CM | POA: Diagnosis not present

## 2021-05-08 DIAGNOSIS — Z794 Long term (current) use of insulin: Secondary | ICD-10-CM | POA: Diagnosis not present

## 2021-05-08 DIAGNOSIS — Z79899 Other long term (current) drug therapy: Secondary | ICD-10-CM | POA: Insufficient documentation

## 2021-05-08 DIAGNOSIS — E669 Obesity, unspecified: Secondary | ICD-10-CM | POA: Diagnosis not present

## 2021-05-08 DIAGNOSIS — N4883 Acquired buried penis: Secondary | ICD-10-CM | POA: Insufficient documentation

## 2021-05-08 DIAGNOSIS — G4730 Sleep apnea, unspecified: Secondary | ICD-10-CM | POA: Insufficient documentation

## 2021-05-08 HISTORY — PX: EXTRACORPOREAL SHOCK WAVE LITHOTRIPSY: SHX1557

## 2021-05-08 LAB — GLUCOSE, CAPILLARY: Glucose-Capillary: 107 mg/dL — ABNORMAL HIGH (ref 70–99)

## 2021-05-08 SURGERY — LITHOTRIPSY, ESWL
Anesthesia: LOCAL | Laterality: Right

## 2021-05-08 MED ORDER — ONDANSETRON HCL 4 MG PO TABS: 4.0000 mg | ORAL_TABLET | Freq: Every day | ORAL | 1 refills | Status: DC | PRN

## 2021-05-08 MED ORDER — DIPHENHYDRAMINE HCL 25 MG PO CAPS
ORAL_CAPSULE | ORAL | Status: AC
  Administered 2021-05-08: 25 mg via ORAL
  Filled 2021-05-08: qty 1

## 2021-05-08 MED ORDER — DIAZEPAM 5 MG PO TABS
ORAL_TABLET | ORAL | Status: AC
  Administered 2021-05-08: 10 mg via ORAL
  Filled 2021-05-08: qty 2

## 2021-05-08 MED ORDER — TAMSULOSIN HCL 0.4 MG PO CAPS: 0.4000 mg | ORAL_CAPSULE | Freq: Every day | ORAL | 0 refills | Status: DC

## 2021-05-08 MED ORDER — DIPHENHYDRAMINE HCL 25 MG PO CAPS: 25.0000 mg | ORAL_CAPSULE | ORAL | Status: AC

## 2021-05-08 MED ORDER — OXYCODONE-ACETAMINOPHEN 5-325 MG PO TABS: 1.0000 | ORAL_TABLET | ORAL | 0 refills | Status: DC | PRN

## 2021-05-08 MED ORDER — SODIUM CHLORIDE 0.9 % IV SOLN: INTRAVENOUS | Status: DC

## 2021-05-08 MED ORDER — DIAZEPAM 5 MG PO TABS: 10.0000 mg | ORAL_TABLET | Freq: Once | ORAL | Status: AC

## 2021-05-08 NOTE — Interval H&P Note (Signed)
History and Physical Interval Note:  05/08/2021 7:47 AM  William Washington  has presented today for surgery, with the diagnosis of Right ureteral stone.  The various methods of treatment have been discussed with the patient and family. After consideration of risks, benefits and other options for treatment, the patient has consented to  Procedure(s): EXTRACORPOREAL SHOCK WAVE LITHOTRIPSY (ESWL) (Right) as a surgical intervention.  The patient's history has been reviewed, patient examined, no change in status, stable for surgery.  I have reviewed the patient's chart and labs.  Questions were answered to the patient's satisfaction.     [name]

## 2021-05-09 ENCOUNTER — Encounter (HOSPITAL_COMMUNITY): Payer: Self-pay | Admitting: Urology

## 2021-05-15 ENCOUNTER — Other Ambulatory Visit (HOSPITAL_COMMUNITY): Payer: Self-pay | Admitting: Surgery

## 2021-05-15 DIAGNOSIS — R718 Other abnormality of red blood cells: Secondary | ICD-10-CM

## 2021-05-15 DIAGNOSIS — D509 Iron deficiency anemia, unspecified: Secondary | ICD-10-CM

## 2021-05-16 ENCOUNTER — Inpatient Hospital Stay (HOSPITAL_COMMUNITY): Payer: Medicare HMO | Attending: Hematology

## 2021-05-16 ENCOUNTER — Other Ambulatory Visit: Payer: Self-pay

## 2021-05-16 DIAGNOSIS — E538 Deficiency of other specified B group vitamins: Secondary | ICD-10-CM | POA: Insufficient documentation

## 2021-05-16 DIAGNOSIS — Z794 Long term (current) use of insulin: Secondary | ICD-10-CM | POA: Diagnosis not present

## 2021-05-16 DIAGNOSIS — I129 Hypertensive chronic kidney disease with stage 1 through stage 4 chronic kidney disease, or unspecified chronic kidney disease: Secondary | ICD-10-CM | POA: Diagnosis not present

## 2021-05-16 DIAGNOSIS — E1122 Type 2 diabetes mellitus with diabetic chronic kidney disease: Secondary | ICD-10-CM | POA: Diagnosis not present

## 2021-05-16 DIAGNOSIS — N1831 Chronic kidney disease, stage 3a: Secondary | ICD-10-CM | POA: Insufficient documentation

## 2021-05-16 DIAGNOSIS — D563 Thalassemia minor: Secondary | ICD-10-CM | POA: Insufficient documentation

## 2021-05-16 DIAGNOSIS — D509 Iron deficiency anemia, unspecified: Secondary | ICD-10-CM | POA: Insufficient documentation

## 2021-05-16 DIAGNOSIS — R718 Other abnormality of red blood cells: Secondary | ICD-10-CM

## 2021-05-16 DIAGNOSIS — E1121 Type 2 diabetes mellitus with diabetic nephropathy: Secondary | ICD-10-CM | POA: Diagnosis not present

## 2021-05-16 LAB — CBC WITH DIFFERENTIAL/PLATELET
Abs Immature Granulocytes: 0.03 10*3/uL (ref 0.00–0.07)
Basophils Absolute: 0.1 10*3/uL (ref 0.0–0.1)
Basophils Relative: 1 %
Eosinophils Absolute: 0.1 10*3/uL (ref 0.0–0.5)
Eosinophils Relative: 2 %
HCT: 30.5 % — ABNORMAL LOW (ref 39.0–52.0)
Hemoglobin: 9.4 g/dL — ABNORMAL LOW (ref 13.0–17.0)
Immature Granulocytes: 1 %
Lymphocytes Relative: 21 %
Lymphs Abs: 1.3 10*3/uL (ref 0.7–4.0)
MCH: 19.9 pg — ABNORMAL LOW (ref 26.0–34.0)
MCHC: 30.8 g/dL (ref 30.0–36.0)
MCV: 64.6 fL — ABNORMAL LOW (ref 80.0–100.0)
Monocytes Absolute: 0.5 10*3/uL (ref 0.1–1.0)
Monocytes Relative: 8 %
Neutro Abs: 4.3 10*3/uL (ref 1.7–7.7)
Neutrophils Relative %: 67 %
Platelets: 182 10*3/uL (ref 150–400)
RBC: 4.72 MIL/uL (ref 4.22–5.81)
RDW: 17.4 % — ABNORMAL HIGH (ref 11.5–15.5)
WBC: 6.4 10*3/uL (ref 4.0–10.5)
nRBC: 0 % (ref 0.0–0.2)

## 2021-05-16 LAB — COMPREHENSIVE METABOLIC PANEL
ALT: 17 U/L (ref 0–44)
AST: 16 U/L (ref 15–41)
Albumin: 3.6 g/dL (ref 3.5–5.0)
Alkaline Phosphatase: 63 U/L (ref 38–126)
Anion gap: 5 (ref 5–15)
BUN: 17 mg/dL (ref 8–23)
CO2: 27 mmol/L (ref 22–32)
Calcium: 8.5 mg/dL — ABNORMAL LOW (ref 8.9–10.3)
Chloride: 103 mmol/L (ref 98–111)
Creatinine, Ser: 1.07 mg/dL (ref 0.61–1.24)
GFR, Estimated: >60 mL/min (ref >60)
Glucose, Bld: 219 mg/dL — ABNORMAL HIGH (ref 70–99)
Potassium: 3.7 mmol/L (ref 3.5–5.1)
Sodium: 135 mmol/L (ref 135–145)
Total Bilirubin: 1 mg/dL (ref 0.3–1.2)
Total Protein: 6.6 g/dL (ref 6.5–8.1)

## 2021-05-16 LAB — IRON AND TIBC
Iron: 88 ug/dL (ref 45–182)
Saturation Ratios: 26 % (ref 17.9–39.5)
TIBC: 342 ug/dL (ref 250–450)
UIBC: 254 ug/dL

## 2021-05-16 LAB — LACTATE DEHYDROGENASE: LDH: 181 U/L (ref 98–192)

## 2021-05-16 LAB — FERRITIN: Ferritin: 62 ng/mL (ref 24–336)

## 2021-05-16 LAB — VITAMIN B12: Vitamin B-12: 624 pg/mL (ref 180–914)

## 2021-05-18 DIAGNOSIS — E1129 Type 2 diabetes mellitus with other diabetic kidney complication: Secondary | ICD-10-CM | POA: Diagnosis not present

## 2021-05-22 DIAGNOSIS — E785 Hyperlipidemia, unspecified: Secondary | ICD-10-CM | POA: Diagnosis not present

## 2021-05-22 DIAGNOSIS — R7309 Other abnormal glucose: Secondary | ICD-10-CM | POA: Diagnosis not present

## 2021-05-22 DIAGNOSIS — E1122 Type 2 diabetes mellitus with diabetic chronic kidney disease: Secondary | ICD-10-CM | POA: Diagnosis not present

## 2021-05-22 DIAGNOSIS — N1832 Chronic kidney disease, stage 3b: Secondary | ICD-10-CM | POA: Diagnosis not present

## 2021-05-22 DIAGNOSIS — I447 Left bundle-branch block, unspecified: Secondary | ICD-10-CM | POA: Diagnosis not present

## 2021-05-22 DIAGNOSIS — I700 Atherosclerosis of aorta: Secondary | ICD-10-CM | POA: Diagnosis not present

## 2021-05-22 DIAGNOSIS — Z6834 Body mass index (BMI) 34.0-34.9, adult: Secondary | ICD-10-CM | POA: Diagnosis not present

## 2021-05-22 DIAGNOSIS — I10 Essential (primary) hypertension: Secondary | ICD-10-CM | POA: Diagnosis not present

## 2021-05-24 NOTE — Progress Notes (Signed)
[place], [place] [address]   CLINIC:  Medical Oncology/Hematology  PCP:  Asencion Noble, MD [address] [place] [address] [phone]   REASON FOR VISIT:  Follow-up for iron deficiency anemia and thalassemia trait  CURRENT THERAPY: Intermittent IV iron infusions  INTERVAL HISTORY:  Mr. William Washington 75 y.o. male returns for routine follow-up of microcytic anemia, which is secondary to thalassemia trait and iron deficiency.  He was last evaluated by NP Faythe Casa on 08/08/2020.  At today's visit, he reports feeling fairly well overall.  No recent hospitalizations, surgeries, or changes in baseline health status.  He reports that his energy levels are at baseline, and states that they did improve slightly after his IV iron infusion a year ago.  He does have some baseline dyspnea on exertion, reports that this is being followed by his PCP.  He denies any chest pain, dizziness, or syncopal episodes.  He has had some recent hematuria secondary to lithotripsy and passing of kidney stones.  He denies any other major source of blood loss such as hematemesis, hematochezia, melena, or epistaxis.  No pica.  No B symptoms such as fever, chills, night sweats, unintentional weight loss.  He has 75% energy and 100% appetite. He endorses that he is maintaining a stable weight.    REVIEW OF SYSTEMS:  Review of Systems  Constitutional:  Negative for appetite change, chills, diaphoresis, fever and unexpected weight change.  HENT:   Negative for lump/mass and nosebleeds.   Eyes:  Negative for eye problems.  Respiratory:  Positive for shortness of breath. Negative for cough and hemoptysis.   Cardiovascular:  Negative for chest pain, leg swelling and palpitations.  Gastrointestinal:  Negative for abdominal pain, blood in stool, constipation, diarrhea, nausea and vomiting.  Genitourinary:  Positive for hematuria.   Skin: Negative.   Neurological:   Negative for dizziness, headaches and light-headedness.  Hematological:  Does not bruise/bleed easily.  Psychiatric/Behavioral:  Positive for sleep disturbance.      PAST MEDICAL/SURGICAL HISTORY:  Past Medical History:  Diagnosis Date   Diabetes mellitus without complication ([place])    Hypercholesteremia    Hypertension    Sleep apnea    Past Surgical History:  Procedure Laterality Date   COLONOSCOPY WITH PROPOFOL N/A 07/07/2020   Procedure: COLONOSCOPY WITH PROPOFOL;  Surgeon: Eloise Harman, DO;  [facility];  Service: Endoscopy;  Laterality: N/A;  9:00   EXTRACORPOREAL SHOCK WAVE LITHOTRIPSY Right 05/08/2021   Procedure: EXTRACORPOREAL SHOCK WAVE LITHOTRIPSY (ESWL);  Surgeon: Cleon Gustin, MD;  Location: [facility];  Service: Urology;  Laterality: Right;   MENISCECTOMY Left    ORIF FEMUR FRACTURE Left    POLYPECTOMY  07/07/2020   Procedure: POLYPECTOMY;  Surgeon: Eloise Harman, DO;  [facility];  Service: Endoscopy;;     SOCIAL HISTORY:  Social History   Socioeconomic History   Marital status: Significant Other    Spouse name: Not on file   Number of children: Not on file   Years of education: Not on file   Highest education level: Not on file  Occupational History   Not on file  Tobacco Use   Smoking status: Never   Smokeless tobacco: Never  Vaping Use   Vaping Use: Never used  Substance and Sexual Activity   Alcohol use: Yes    Comment: rare   Drug use: Never   Sexual activity: Not on file  Other Topics Concern  Not on file  Social History Narrative   Not on file   Social Determinants of Health   Financial Resource Strain: Low Risk    Difficulty of Paying Living Expenses: Not hard at all  Food Insecurity: No Food Insecurity   Worried About Charity fundraiser in the Last Year: Never true   Arboriculturist in the Last Year: Never true  Transportation Needs: No Transportation Needs   Lack of Transportation (Medical): No    Lack of Transportation (Non-Medical): No  Physical Activity: Inactive   Days of Exercise per Week: 0 days   Minutes of Exercise per Session: 0 min  Stress: No Stress Concern Present   Feeling of Stress : Not at all  Social Connections: Moderately Isolated   Frequency of Communication with Friends and Family: More than three times a week   Frequency of Social Gatherings with Friends and Family: More than three times a week   Attends Religious Services: Never   Marine scientist or Organizations: No   Attends Music therapist: Never   Marital Status: Living with partner  Intimate Partner Violence: Not At Risk   Fear of Current or Ex-Partner: No   Emotionally Abused: No   Physically Abused: No   Sexually Abused: No    FAMILY HISTORY:  Family History  Problem Relation Age of Onset   Severe sprains Neg Hx    Scoliosis Neg Hx     CURRENT MEDICATIONS:  Outpatient Encounter Medications as of 05/25/2021  Medication Sig   aspirin EC 81 MG tablet Take 81 mg by mouth daily. Swallow whole.   atorvastatin (LIPITOR) 40 MG tablet Take 40 mg by mouth at bedtime.    Cholecalciferol (EQL VITAMIN D3) 50 MCG (2000 UT) CAPS Take 2,000 Units by mouth in the morning and at bedtime.    loratadine-pseudoephedrine (CLARITIN-D 24-HOUR) 10-240 MG 24 hr tablet Take 1 tablet by mouth every other day.    Omega-3 Fatty Acids (FISH OIL) 1000 MG CAPS Take 2,000 mg by mouth 2 (two) times daily.    omeprazole (PRILOSEC) 20 MG capsule Take 1 capsule (20 mg total) by mouth 2 (two) times daily before a meal.   ondansetron (ZOFRAN) 4 MG tablet Take 1 tablet (4 mg total) by mouth daily as needed for nausea or vomiting.   oxybutynin (DITROPAN) 5 MG tablet Take 1 tablet (5 mg total) by mouth every 8 (eight) hours as needed for up to 15 doses (Urinary frequency/urgency).   oxyCODONE-acetaminophen (PERCOCET) 5-325 MG tablet Take 1 tablet by mouth every 4 (four) hours as needed for severe pain.    oxymetazoline (AFRIN) 0.05 % nasal spray Place 1 spray into both nostrils daily as needed for congestion.   pioglitazone (ACTOS) 30 MG tablet Take 30 mg by mouth daily.   ramipril (ALTACE) 5 MG capsule Take 5 mg by mouth daily.   tamsulosin (FLOMAX) 0.4 MG CAPS capsule Take 1 capsule (0.4 mg total) by mouth daily after supper.   TRESIBA FLEXTOUCH 100 UNIT/ML FlexTouch Pen Inject 30 Units into the skin at bedtime.   Turmeric 500 MG CAPS Take 1,000 mg by mouth daily.    vitamin B-12 (CYANOCOBALAMIN) 1000 MCG tablet Take 1,000 mcg by mouth daily.   No facility-administered encounter medications on file as of 05/25/2021.    ALLERGIES:  No Known Allergies   PHYSICAL EXAM:  ECOG PERFORMANCE STATUS: 1 - Symptomatic but completely ambulatory  There were no vitals filed for this visit.  There were no vitals filed for this visit. Physical Exam Constitutional:      Appearance: Normal appearance. He is obese.  HENT:     Head: Normocephalic and atraumatic.     Mouth/Throat:     Mouth: Mucous membranes are moist.  Eyes:     Extraocular Movements: Extraocular movements intact.     Pupils: Pupils are equal, round, and reactive to light.  Cardiovascular:     Rate and Rhythm: Normal rate and regular rhythm.     Pulses: Normal pulses.     Heart sounds: Normal heart sounds.  Pulmonary:     Effort: Pulmonary effort is normal.     Breath sounds: Normal breath sounds.  Abdominal:     General: Bowel sounds are normal.     Palpations: Abdomen is soft.     Tenderness: There is no abdominal tenderness.  Musculoskeletal:        General: No swelling.     Right lower leg: No edema.     Left lower leg: No edema.  Lymphadenopathy:     Cervical: No cervical adenopathy.  Skin:    General: Skin is warm and dry.  Neurological:     General: No focal deficit present.     Mental Status: He is alert and oriented to person, place, and time.  Psychiatric:        Mood and Affect: Mood normal.         Behavior: Behavior normal.     LABORATORY DATA:  I have reviewed the labs as listed.  CBC    Component Value Date/Time   WBC 6.4 05/16/2021 1446   RBC 4.72 05/16/2021 1446   HGB 9.4 (L) 05/16/2021 1446   HCT 30.5 (L) 05/16/2021 1446   PLT 182 05/16/2021 1446   MCV 64.6 (L) 05/16/2021 1446   MCH 19.9 (L) 05/16/2021 1446   MCHC 30.8 05/16/2021 1446   RDW 17.4 (H) 05/16/2021 1446   LYMPHSABS 1.3 05/16/2021 1446   MONOABS 0.5 05/16/2021 1446   EOSABS 0.1 05/16/2021 1446   BASOSABS 0.1 05/16/2021 1446   CMP Latest Ref Rng & Units 05/16/2021 04/03/2021  Glucose 70 - 99 mg/dL 219(H) -  BUN 8 - 23 mg/dL 17 14  Creatinine 0.61 - 1.24 mg/dL 1.07 1.15  Sodium 135 - 145 mmol/L 135 -  Potassium 3.5 - 5.1 mmol/L 3.7 -  Chloride 98 - 111 mmol/L 103 -  CO2 22 - 32 mmol/L 27 -  Calcium 8.9 - 10.3 mg/dL 8.5(L) -  Total Protein 6.5 - 8.1 g/dL 6.6 -  Total Bilirubin 0.3 - 1.2 mg/dL 1.0 -  Alkaline Phos 38 - 126 U/L 63 -  AST 15 - 41 U/L 16 -  ALT 0 - 44 U/L 17 -    DIAGNOSTIC IMAGING:  I have independently reviewed the relevant imaging and discussed with the patient.  ASSESSMENT & PLAN: 1.  Microcytic anemia, secondary to iron deficiency and thalassemia trait - Patient was initially seen at the request of Dr. Willey Blade.  CBC on 05/10/2020 showed hemoglobin 9.9 with MCV of 64.  White count and platelets were normal.  Vitamin B12 was 312. - He has a history of thalassemia trait.  He has [place] ancestry.  Most of his family members reportedly have thalassemia trait. - No history of blood transfusions. - Colonoscopy from October 2021 showed some internal nonbleeding hemorrhoids and for polyps that were removed and sent for testing.  Pathology was negative.  - SPEP was normal. -  He received IV Feraheme x 2 in September 2021, reported slightly improved energy.  No improvement in hemoglobin after infusions. - He has been taking liquid iron daily any adverse side effects - Denies any bleeding per  rectum or melena.   - Energy levels and dyspnea on exertion are at baseline. - Most recent labs (05/16/2021): Hgb 9.4 with MCV 94.6 and normal RBC 4.72.  Ferritin 62 with with iron saturation 26% - PLAN: No indication for IV iron at this time.  Continue daily liquid iron supplement.  Patient's baseline hemoglobin is around 9.0, which is expected considering his thalassemia trait.  Repeat labs and RTC in 6 months.  2.  B12 deficiency - Most recent B12 (05/16/2021) is normal at 624 - PLAN: Continue taking vitamin B12 cyanocobalamin 1 mg tablet daily   3.  CKD: - He has mild chronic kidney disease, with baseline creatinine ranging from about 1.1 to 1.4   PLAN SUMMARY & DISPOSITION: -Continue iron and B12 supplements - Labs and RTC in 6 months  All questions were answered. The patient knows to call the clinic with any problems, questions or concerns.  Medical decision making: Low  Time spent on visit: I spent 20 minutes counseling the patient face to face. The total time spent in the appointment was 30 minutes and more than 50% was on counseling.   Harriett Rush, PA-C  05/25/2021 10:41 AM

## 2021-05-25 ENCOUNTER — Other Ambulatory Visit: Payer: Self-pay

## 2021-05-25 ENCOUNTER — Inpatient Hospital Stay (HOSPITAL_COMMUNITY): Payer: Medicare HMO | Admitting: Physician Assistant

## 2021-05-25 VITALS — BP 113/67 | HR 95 | Temp 97.0°F | Resp 18 | Wt 205.4 lb

## 2021-05-25 DIAGNOSIS — E1122 Type 2 diabetes mellitus with diabetic chronic kidney disease: Secondary | ICD-10-CM | POA: Diagnosis not present

## 2021-05-25 DIAGNOSIS — D509 Iron deficiency anemia, unspecified: Secondary | ICD-10-CM

## 2021-05-25 DIAGNOSIS — Z794 Long term (current) use of insulin: Secondary | ICD-10-CM | POA: Diagnosis not present

## 2021-05-25 DIAGNOSIS — E1121 Type 2 diabetes mellitus with diabetic nephropathy: Secondary | ICD-10-CM | POA: Diagnosis not present

## 2021-05-25 DIAGNOSIS — N1831 Chronic kidney disease, stage 3a: Secondary | ICD-10-CM | POA: Diagnosis not present

## 2021-05-25 DIAGNOSIS — D563 Thalassemia minor: Secondary | ICD-10-CM | POA: Diagnosis not present

## 2021-05-25 DIAGNOSIS — E538 Deficiency of other specified B group vitamins: Secondary | ICD-10-CM | POA: Diagnosis not present

## 2021-05-25 DIAGNOSIS — I129 Hypertensive chronic kidney disease with stage 1 through stage 4 chronic kidney disease, or unspecified chronic kidney disease: Secondary | ICD-10-CM | POA: Diagnosis not present

## 2021-05-25 DIAGNOSIS — D569 Thalassemia, unspecified: Secondary | ICD-10-CM

## 2021-05-25 NOTE — Patient Instructions (Signed)
[place] at [facility] Discharge Instructions  You were seen today by Tarri Abernethy PA-C for your iron deficiency anemia and thalassemia.  Your iron levels looked good today.  You can continue to take the liquid iron supplement at home, as this may help to decrease the frequency that you need IV iron infusions.  (You do not need any IV iron at this visit).  Your hemoglobin remains stable between at 9.0-10.0.  Due to your underlying thalassemia, this is most likely your baseline range and does not require treatment at this time.  We will continue to monitor with repeat labs and follow-up visits.  LABS: Return in 6 months for repeat labs (CBC, iron, and B12)  OTHER TESTS: No other tests at this time  MEDICATIONS: Continue B12 supplement and liquid iron supplement.  FOLLOW-UP APPOINTMENT: Office visit after labs (6 months)   Thank you for choosing [place] at [facility] to provide your oncology and hematology care.  To afford each patient quality time with our provider, please arrive at least 15 minutes before your scheduled appointment time.   If you have a lab appointment with the [place] please come in thru the Main Entrance and check in at the main information desk.  You need to re-schedule your appointment should you arrive 10 or more minutes late.  We strive to give you quality time with our providers, and arriving late affects you and other patients whose appointments are after yours.  Also, if you no show three or more times for appointments you may be dismissed from the clinic at the providers discretion.     Again, thank you for choosing [facility].  Our hope is that these requests will decrease the amount of time that you wait before being seen by our physicians.       _____________________________________________________________  Should you have questions after your visit to [facility],  please contact our office at [phone] and follow the prompts.  Our office hours are 8:00 a.m. and 4:30 p.m. Monday - Friday.  Please note that voicemails left after 4:00 p.m. may not be returned until the following business day.  We are closed weekends and major holidays.  You do have access to a nurse 24-7, just call the main number to the clinic [phone] and do not press any options, hold on the line and a nurse will answer the phone.    For prescription refill requests, have your pharmacy contact our office and allow 72 hours.    Due to Covid, you will need to wear a mask upon entering the hospital. If you do not have a mask, a mask will be given to you at the Main Entrance upon arrival. For doctor visits, patients may have 1 support person age 87 or older with them. For treatment visits, patients can not have anyone with them due to social distancing guidelines and our immunocompromised population.

## 2021-05-28 ENCOUNTER — Other Ambulatory Visit: Payer: Self-pay

## 2021-05-28 ENCOUNTER — Ambulatory Visit (HOSPITAL_COMMUNITY)
Admission: RE | Admit: 2021-05-28 | Discharge: 2021-05-28 | Disposition: A | Discharge status: Home / Self Care | Payer: Medicare HMO | Source: Ambulatory Visit | Attending: Urology | Admitting: Urology

## 2021-05-28 DIAGNOSIS — N200 Calculus of kidney: Secondary | ICD-10-CM | POA: Diagnosis not present

## 2021-05-28 DIAGNOSIS — N201 Calculus of ureter: Secondary | ICD-10-CM | POA: Diagnosis not present

## 2021-05-29 ENCOUNTER — Telehealth (INDEPENDENT_AMBULATORY_CARE_PROVIDER_SITE_OTHER): Payer: Medicare HMO | Admitting: Urology

## 2021-05-29 ENCOUNTER — Encounter: Payer: Self-pay | Admitting: Urology

## 2021-05-29 DIAGNOSIS — N201 Calculus of ureter: Secondary | ICD-10-CM

## 2021-05-29 NOTE — Progress Notes (Signed)
05/29/2021 1:22 PM   William Washington [birth year] [id number]  Referring provider: Asencion Noble, MD [address] [place],  [place] [id number]  Patient location: home Physician location: office I connected with  Kahari Critzer on 05/29/21 by a video enabled telemedicine application and verified that I am speaking with the correct person using two identifiers.   I discussed the limitations of evaluation and management by telemedicine. The patient expressed understanding and agreed to proceed.   Nephrolithiasis   HPI: Mr William Washington is a 75yo here for followup for nephrolithiasis. He underwent ESWL 3 weeks ago and has not passed any fragments. He continues to have penile pain. No significant flank pain. KUb from yesterday shows unchanged rigth 11mm ureteral calculus. NO significant LUTS   PMH: Past Medical History:  Diagnosis Date   Diabetes mellitus without complication ([place])    Hypercholesteremia    Hypertension    Sleep apnea     Surgical History: Past Surgical History:  Procedure Laterality Date   COLONOSCOPY WITH PROPOFOL N/A 07/07/2020   Procedure: COLONOSCOPY WITH PROPOFOL;  Surgeon: Eloise Harman, DO;  [facility];  Service: Endoscopy;  Laterality: N/A;  9:00   EXTRACORPOREAL SHOCK WAVE LITHOTRIPSY Right 05/08/2021   Procedure: EXTRACORPOREAL SHOCK WAVE LITHOTRIPSY (ESWL);  Surgeon: Cleon Gustin, MD;  Location: [facility] ORS;  Service: Urology;  Laterality: Right;   MENISCECTOMY Left    ORIF FEMUR FRACTURE Left    POLYPECTOMY  07/07/2020   Procedure: POLYPECTOMY;  Surgeon: Eloise Harman, DO;  Location: [facility];  Service: Endoscopy;;    Home Medications:  Allergies as of 05/29/2021   No Known Allergies      Medication List        Accurate as of May 29, 2021  1:22 PM. If you have any questions, ask your nurse or doctor.          aspirin EC 81 MG tablet Take 81 mg by mouth daily. Swallow whole.   atorvastatin 40 MG  tablet Commonly known as: LIPITOR Take 40 mg by mouth at bedtime.   EQL Vitamin D3 50 MCG (2000 UT) Caps Generic drug: Cholecalciferol Take 2,000 Units by mouth in the morning and at bedtime.   Fish Oil 1000 MG Caps Take 2,000 mg by mouth 2 (two) times daily.   loratadine-pseudoephedrine 10-240 MG 24 hr tablet Commonly known as: CLARITIN-D 24-hour Take 1 tablet by mouth every other day.   omeprazole 20 MG capsule Commonly known as: PRILOSEC Take 1 capsule (20 mg total) by mouth 2 (two) times daily before a meal.   ondansetron 4 MG tablet Commonly known as: Zofran Take 1 tablet (4 mg total) by mouth daily as needed for nausea or vomiting.   oxybutynin 5 MG tablet Commonly known as: DITROPAN Take 1 tablet (5 mg total) by mouth every 8 (eight) hours as needed for up to 15 doses (Urinary frequency/urgency).   oxyCODONE-acetaminophen 5-325 MG tablet Commonly known as: Percocet Take 1 tablet by mouth every 4 (four) hours as needed for severe pain.   oxymetazoline 0.05 % nasal spray Commonly known as: AFRIN Place 1 spray into both nostrils daily as needed for congestion.   pioglitazone 30 MG tablet Commonly known as: ACTOS Take 30 mg by mouth daily.   ramipril 5 MG capsule Commonly known as: ALTACE Take 5 mg by mouth daily.   tamsulosin 0.4 MG Caps capsule Commonly known as: Flomax Take 1 capsule (0.4 mg total) by mouth daily after supper.  Tyler Aas FlexTouch 100 UNIT/ML FlexTouch Pen Generic drug: insulin degludec Inject 30 Units into the skin at bedtime.   Turmeric 500 MG Caps Take 1,000 mg by mouth daily.   vitamin B-12 1000 MCG tablet Commonly known as: CYANOCOBALAMIN Take 1,000 mcg by mouth daily.        Allergies: No Known Allergies  Family History: Family History  Problem Relation Age of Onset   Severe sprains Neg Hx    Scoliosis Neg Hx     Social History:  reports that he has never smoked. He has never used smokeless tobacco. He reports current  alcohol use. He reports that he does not use drugs.  ROS: All other review of systems were reviewed and are negative except what is noted above in HPI   Laboratory Data: Lab Results  Component Value Date   WBC 6.4 05/16/2021   HGB 9.4 (L) 05/16/2021   HCT 30.5 (L) 05/16/2021   MCV 64.6 (L) 05/16/2021   PLT 182 05/16/2021    Lab Results  Component Value Date   CREATININE 1.07 05/16/2021    No results found for: PSA  No results found for: TESTOSTERONE  No results found for: HGBA1C  Urinalysis    Component Value Date/Time   APPEARANCEUR Clear 04/24/2021 1329   GLUCOSEU Negative 04/24/2021 1329   BILIRUBINUR Negative 04/24/2021 1329   PROTEINUR 1+ (A) 04/24/2021 1329   NITRITE Negative 04/24/2021 1329   LEUKOCYTESUR Trace (A) 04/24/2021 1329    Lab Results  Component Value Date   LABMICR See below: 04/24/2021   WBCUA 0-5 04/24/2021   LABEPIT None seen 04/24/2021   MUCUS Present 02/06/2021   BACTERIA None seen 04/24/2021    Pertinent Imaging: KUB yesterday: Images reviewed and disucssed with the patient  Results for orders placed during the hospital encounter of 05/28/21  DG Abd 1 View  Narrative CLINICAL DATA:  Kidney stone.  EXAM: ABDOMEN - 1 VIEW  COMPARISON:  Abdominal x-ray 05/08/2021. CT abdomen and pelvis 04/10/2021.  FINDINGS: The bowel gas pattern is normal. Again seen are 2 radiopaque calculi in the left kidney measuring up to 5 mm. These are unchanged in size and position. There is a stable ovoid 11 mm calcification just right of L5, unchanged in size and position. No new suspicious calculi are seen.  IMPRESSION: 1. Unchanged left renal calculi. 2. Unchanged ovoid calcification to the right of L5 measuring 11 mm.   Electronically Signed By: Ronney Asters M.D. On: 05/28/2021 22:49  No results found for this or any previous visit.  No results found for this or any previous visit.  No results found for this or any previous  visit.  No results found for this or any previous visit.  No results found for this or any previous visit.  No results found for this or any previous visit.  No results found for this or any previous visit.   Assessment & Plan:    1. Calculus of ureter -We discussed the management of kidney stones. These options include observation, ureteroscopy, shockwave lithotripsy (ESWL) and percutaneous nephrolithotomy (PCNL). We discussed which options are relevant to the patient's stone(s). We discussed the natural history of kidney stones as well as the complications of untreated stones and the impact on quality of life without treatment as well as with each of the above listed treatments. We also discussed the efficacy of each treatment in its ability to clear the stone burden. With any of these management options I discussed the signs and  symptoms of infection and the need for emergent treatment should these be experienced. For each option we discussed the ability of each procedure to clear the patient of their stone burden.   For observation I described the risks which include but are not limited to silent renal damage, life-threatening infection, need for emergent surgery, failure to pass stone and pain.   For ureteroscopy I described the risks which include bleeding, infection, damage to contiguous structures, positioning injury, ureteral stricture, ureteral avulsion, ureteral injury, need for prolonged ureteral stent, inability to perform ureteroscopy, need for an interval procedure, inability to clear stone burden, stent discomfort/pain, heart attack, stroke, pulmonary embolus and the inherent risks with general anesthesia.   For shockwave lithotripsy I described the risks which include arrhythmia, kidney contusion, kidney hemorrhage, need for transfusion, pain, inability to adequately break up stone, inability to pass stone fragments, Steinstrasse, infection associated with obstructing stones,  need for alternate surgical procedure, need for repeat shockwave lithotripsy, MI, CVA, PE and the inherent risks with anesthesia/conscious sedation.   For PCNL I described the risks including positioning injury, pneumothorax, hydrothorax, need for chest tube, inability to clear stone burden, renal laceration, arterial venous fistula or malformation, need for embolization of kidney, loss of kidney or renal function, need for repeat procedure, need for prolonged nephrostomy tube, ureteral avulsion, MI, CVA, PE and the inherent risks of general anesthesia.   - The patient would like to proceed with Right ureteroscopic stone extraction   No follow-ups on file.  [name], MD  [facility] [place]

## 2021-05-29 NOTE — H&P (View-Only) (Signed)
05/29/2021 1:22 PM   William Washington Mar 14, 1946 [id number]  Referring provider: Asencion Noble, MD [address] [place],  [place] [address]  Patient location: home Physician location: office I connected with  William Washington on 05/29/21 by a video enabled telemedicine application and verified that I am speaking with the correct person using two identifiers.   I discussed the limitations of evaluation and management by telemedicine. The patient expressed understanding and agreed to proceed.   Nephrolithiasis   HPI: William Washington is a 75yo here for followup for nephrolithiasis. He underwent ESWL 3 weeks ago and has not passed any fragments. He continues to have penile pain. No significant flank pain. KUb from yesterday shows unchanged rigth 4mm ureteral calculus. NO significant LUTS   PMH: Past Medical History:  Diagnosis Date   Diabetes mellitus without complication ([place])    Hypercholesteremia    Hypertension    Sleep apnea     Surgical History: Past Surgical History:  Procedure Laterality Date   COLONOSCOPY WITH PROPOFOL N/A 07/07/2020   Procedure: COLONOSCOPY WITH PROPOFOL;  Surgeon: Eloise Harman, DO;  [facility];  Service: Endoscopy;  Laterality: N/A;  9:00   EXTRACORPOREAL SHOCK WAVE LITHOTRIPSY Right 05/08/2021   Procedure: EXTRACORPOREAL SHOCK WAVE LITHOTRIPSY (ESWL);  Surgeon: Cleon Gustin, MD;  Location: [facility] ORS;  Service: Urology;  Laterality: Right;   MENISCECTOMY Left    ORIF FEMUR FRACTURE Left    POLYPECTOMY  07/07/2020   Procedure: POLYPECTOMY;  Surgeon: Eloise Harman, DO;  [facility];  Service: Endoscopy;;    Home Medications:  Allergies as of 05/29/2021   No Known Allergies      Medication List        Accurate as of May 29, 2021  1:22 PM. If you have any questions, ask your nurse or doctor.          aspirin EC 81 MG tablet Take 81 mg by mouth daily. Swallow whole.   atorvastatin 40 MG  tablet Commonly known as: LIPITOR Take 40 mg by mouth at bedtime.   EQL Vitamin D3 50 MCG (2000 UT) Caps Generic drug: Cholecalciferol Take 2,000 Units by mouth in the morning and at bedtime.   Fish Oil 1000 MG Caps Take 2,000 mg by mouth 2 (two) times daily.   loratadine-pseudoephedrine 10-240 MG 24 hr tablet Commonly known as: CLARITIN-D 24-hour Take 1 tablet by mouth every other day.   omeprazole 20 MG capsule Commonly known as: PRILOSEC Take 1 capsule (20 mg total) by mouth 2 (two) times daily before a meal.   ondansetron 4 MG tablet Commonly known as: Zofran Take 1 tablet (4 mg total) by mouth daily as needed for nausea or vomiting.   oxybutynin 5 MG tablet Commonly known as: DITROPAN Take 1 tablet (5 mg total) by mouth every 8 (eight) hours as needed for up to 15 doses (Urinary frequency/urgency).   oxyCODONE-acetaminophen 5-325 MG tablet Commonly known as: Percocet Take 1 tablet by mouth every 4 (four) hours as needed for severe pain.   oxymetazoline 0.05 % nasal spray Commonly known as: AFRIN Place 1 spray into both nostrils daily as needed for congestion.   pioglitazone 30 MG tablet Commonly known as: ACTOS Take 30 mg by mouth daily.   ramipril 5 MG capsule Commonly known as: ALTACE Take 5 mg by mouth daily.   tamsulosin 0.4 MG Caps capsule Commonly known as: Flomax Take 1 capsule (0.4 mg total) by mouth daily after supper.  Tyler Aas FlexTouch 100 UNIT/ML FlexTouch Pen Generic drug: insulin degludec Inject 30 Units into the skin at bedtime.   Turmeric 500 MG Caps Take 1,000 mg by mouth daily.   vitamin B-12 1000 MCG tablet Commonly known as: CYANOCOBALAMIN Take 1,000 mcg by mouth daily.        Allergies: No Known Allergies  Family History: Family History  Problem Relation Age of Onset   Severe sprains Neg Hx    Scoliosis Neg Hx     Social History:  reports that he has never smoked. He has never used smokeless tobacco. He reports current  alcohol use. He reports that he does not use drugs.  ROS: All other review of systems were reviewed and are negative except what is noted above in HPI   Laboratory Data: Lab Results  Component Value Date   WBC 6.4 05/16/2021   HGB 9.4 (L) 05/16/2021   HCT 30.5 (L) 05/16/2021   MCV 64.6 (L) 05/16/2021   PLT 182 05/16/2021    Lab Results  Component Value Date   CREATININE 1.07 05/16/2021    No results found for: PSA  No results found for: TESTOSTERONE  No results found for: HGBA1C  Urinalysis    Component Value Date/Time   APPEARANCEUR Clear 04/24/2021 1329   GLUCOSEU Negative 04/24/2021 1329   BILIRUBINUR Negative 04/24/2021 1329   PROTEINUR 1+ (A) 04/24/2021 1329   NITRITE Negative 04/24/2021 1329   LEUKOCYTESUR Trace (A) 04/24/2021 1329    Lab Results  Component Value Date   LABMICR See below: 04/24/2021   WBCUA 0-5 04/24/2021   LABEPIT None seen 04/24/2021   MUCUS Present 02/06/2021   BACTERIA None seen 04/24/2021    Pertinent Imaging: KUB yesterday: Images reviewed and disucssed with the patient  Results for orders placed during the hospital encounter of 05/28/21  DG Abd 1 View  Narrative CLINICAL DATA:  Kidney stone.  EXAM: ABDOMEN - 1 VIEW  COMPARISON:  Abdominal x-ray 05/08/2021. CT abdomen and pelvis 04/10/2021.  FINDINGS: The bowel gas pattern is normal. Again seen are 2 radiopaque calculi in the left kidney measuring up to 5 mm. These are unchanged in size and position. There is a stable ovoid 11 mm calcification just right of L5, unchanged in size and position. No new suspicious calculi are seen.  IMPRESSION: 1. Unchanged left renal calculi. 2. Unchanged ovoid calcification to the right of L5 measuring 11 mm.   Electronically Signed By: Ronney Asters M.D. On: 05/28/2021 22:49  No results found for this or any previous visit.  No results found for this or any previous visit.  No results found for this or any previous  visit.  No results found for this or any previous visit.  No results found for this or any previous visit.  No results found for this or any previous visit.  No results found for this or any previous visit.   Assessment & Plan:    1. Calculus of ureter -We discussed the management of kidney stones. These options include observation, ureteroscopy, shockwave lithotripsy (ESWL) and percutaneous nephrolithotomy (PCNL). We discussed which options are relevant to the patient's stone(s). We discussed the natural history of kidney stones as well as the complications of untreated stones and the impact on quality of life without treatment as well as with each of the above listed treatments. We also discussed the efficacy of each treatment in its ability to clear the stone burden. With any of these management options I discussed the signs and  symptoms of infection and the need for emergent treatment should these be experienced. For each option we discussed the ability of each procedure to clear the patient of their stone burden.   For observation I described the risks which include but are not limited to silent renal damage, life-threatening infection, need for emergent surgery, failure to pass stone and pain.   For ureteroscopy I described the risks which include bleeding, infection, damage to contiguous structures, positioning injury, ureteral stricture, ureteral avulsion, ureteral injury, need for prolonged ureteral stent, inability to perform ureteroscopy, need for an interval procedure, inability to clear stone burden, stent discomfort/pain, heart attack, stroke, pulmonary embolus and the inherent risks with general anesthesia.   For shockwave lithotripsy I described the risks which include arrhythmia, kidney contusion, kidney hemorrhage, need for transfusion, pain, inability to adequately break up stone, inability to pass stone fragments, Steinstrasse, infection associated with obstructing stones,  need for alternate surgical procedure, need for repeat shockwave lithotripsy, MI, CVA, PE and the inherent risks with anesthesia/conscious sedation.   For PCNL I described the risks including positioning injury, pneumothorax, hydrothorax, need for chest tube, inability to clear stone burden, renal laceration, arterial venous fistula or malformation, need for embolization of kidney, loss of kidney or renal function, need for repeat procedure, need for prolonged nephrostomy tube, ureteral avulsion, MI, CVA, PE and the inherent risks of general anesthesia.   - The patient would like to proceed with Right ureteroscopic stone extraction   No follow-ups on file.  [name], MD  [facility] [place]

## 2021-05-29 NOTE — Patient Instructions (Signed)
Ureteroscopy Ureteroscopy is a procedure to check for and treat problems inside part of the urinary tract. In this procedure, a thin, flexible tube with a light at the end (ureteroscope) is used to look at the inside of the kidneys and the ureters. The ureters are the tubes that carry urine from the kidneys to the bladder. The ureteroscope is inserted into one or both of the ureters. You may need this procedure if you have frequent urinary tract infections (UTIs), blood in your urine, or a stone in one of your ureters. A ureteroscopy can be done: To find the cause of urine blockage in a ureter and to evaluate other abnormalities inside the ureters or kidneys. To remove stones. To remove or treat growths of tissue (polyps), abnormal tissue, and some types of tumors. To remove a tissue sample and check it for disease under a microscope (biopsy). Tell a health care provider about: Any allergies you have. All medicines you are taking, including vitamins, herbs, eye drops, creams, and over-the-counter medicines. Any problems you or family members have had with anesthetic medicines. Any blood disorders you have. Any surgeries you have had. Any medical conditions you have. Whether you are pregnant or may be pregnant. What are the risks? Generally, this is a safe procedure. However, problems may occur, including: Bleeding. Infection. Allergic reactions to medicines. Scarring that narrows the ureter (stricture). Creating a hole in the ureter (perforation). What happens before the procedure? Staying hydrated Follow instructions from your health care provider about hydration, which may include: Up to 2 hours before the procedure - you may continue to drink clear liquids, such as water, clear fruit juice, black coffee, and plain tea.  Eating and drinking restrictions Follow instructions from your health care provider about eating and drinking, which may include: 8 hours before the procedure - stop  eating heavy meals or foods, such as meat, fried foods, or fatty foods. 6 hours before the procedure - stop eating light meals or foods, such as toast or cereal. 6 hours before the procedure - stop drinking milk or drinks that contain milk. 2 hours before the procedure - stop drinking clear liquids. Medicines Ask your health care provider about: Changing or stopping your regular medicines. This is especially important if you are taking diabetes medicines or blood thinners. Taking medicines such as aspirin and ibuprofen. These medicines can thin your blood. Do not take these medicines unless your health care provider tells you to take them. Taking over-the-counter medicines, vitamins, herbs, and supplements. General instructions Do not use any products that contain nicotine or tobacco for at least 4 weeks before the procedure. These products include cigarettes, e-cigarettes, and chewing tobacco. If you need help quitting, ask your health care provider. You may have a urine sample taken to check for infection. Plan to have someone take you home from the hospital or clinic. If you will be going home right after the procedure, plan to have someone with you for 24 hours. Ask your health care provider what steps will be taken to help prevent infection. These may include: Washing skin with a germ-killing soap. Receiving antibiotic medicine. What happens during the procedure?  An IV will be inserted into one of your veins. You will be given one or more of the following: A medicine to help you relax (sedative). A medicine to make you fall asleep (general anesthetic). A medicine that is injected into your spine to numb the area below and slightly above the injection site (spinal anesthetic). The  part of your body that drains urine from your bladder (urethra) will be cleaned with a germ-killing solution. The ureteroscope will be passed through your urethra into your bladder. A salt-water solution will  be sent through the ureteroscope to fill your bladder. This will help the health care provider see the openings of your ureters more clearly. The ureteroscope will be passed into your ureter. If a growth is found, a biopsy may be done. If a stone is found, it may be removed through the ureteroscope, or the stone may be broken up using a laser, shock waves, or electrical energy. In some cases, if the ureter is too small, a tube may be inserted that keeps the ureter open (ureteral stent). The stent may be left in place for 1 or 2 weeks to keep the ureter open, and then the ureteroscopy procedure will be done. The scope will be removed, and your bladder will be emptied. The procedure may vary among health care providers and hospitals. What can I expect after the procedure? After your procedure, it is common to have: Your blood pressure, heart rate, breathing rate, and blood oxygen level monitored until you leave the hospital or clinic. A burning sensation when you urinate. You may be asked to urinate. Blood in your urine. Mild discomfort in your bladder area or kidney area when urinating. A need to urinate more often or urgently. Follow these instructions at home: Medicines Take over-the-counter and prescription medicines only as told by your health care provider. If you were prescribed an antibiotic medicine, take it as told by your health care provider. Do not stop taking the antibiotic even if you start to feel better. General instructions  If you were given a sedative during the procedure, it can affect you for several hours. Do not drive or operate machinery until your health care provider says that it is safe. To relieve burning, take a warm bath or hold a warm washcloth over your groin. Drink enough fluid to keep your urine pale yellow. Drink two 8-ounce (237 mL) glasses of water every hour for the first 2 hours after you get home. Continue to drink water often at home. You can eat what  you normally do. Keep all follow-up visits as told by your health care provider. This is important. If you had a ureteral stent placed, ask your health care provider when you need to return to have it removed. Contact a health care provider if you have: Chills or a fever. Burning pain for longer than 24 hours after the procedure. Blood in your urine for longer than 24 hours after the procedure. Get help right away if you have: Large amounts of blood in your urine. Blood clots in your urine. Severe pain. Chest pain or trouble breathing. The feeling of a full bladder and you are unable to urinate. These symptoms may represent a serious problem that is an emergency. Do not wait to see if the symptoms will go away. Get medical help right away. Call your local emergency services (911 in the [place] Summary Ureteroscopy is a procedure to check for and treat problems inside part of the urinary tract. In this procedure, a thin, flexible tube with a light at the end (ureteroscope) is used to look at the inside of the kidneys and the ureters. You may need this procedure if you have frequent urinary tract infections (UTIs), blood in your urine, or a stone in a ureter. This information is not intended to replace advice given to  you by your health care provider. Make sure you discuss any questions you have with your health care provider. Document Revised: 06/02/2019 Document Reviewed: 06/02/2019 Elsevier Patient Education  [place].

## 2021-06-01 NOTE — Patient Instructions (Signed)
William Washington  06/01/2021     @[email or web address] @   Your procedure is scheduled on 06/07/2021.   Report to [facility] at  0900 A.M.   Call this number if you have problems the morning of surgery:  [phone]   Remember:  Do not eat or drink after midnight.     Take 12.5 units of your tresiba the night before your procedure.     DO NOT take any medications for diabetes the morning of your procedure.     Take these medicines the morning of surgery with A SIP OF WATER                     Oxycodone (if needed).     Do not wear jewelry, make-up or nail polish.  Do not wear lotions, powders, or perfumes, or deodorant.  Do not shave 48 hours prior to surgery.  Men may shave face and neck.  Do not bring valuables to the hospital.  [facility] is not responsible for any belongings or valuables.  Contacts, dentures or bridgework may not be worn into surgery.  Leave your suitcase in the car.  After surgery it may be brought to your room.  For patients admitted to the hospital, discharge time will be determined by your treatment team.  Patients discharged the day of surgery will not be allowed to drive home and must have someone with them for 24 hours.   Special instructions:   DO NOT smoke tobacco or vape for 24 hours before your procedure.  Please read over the following fact sheets that you were given. Coughing and Deep Breathing, Surgical Site Infection Prevention, Anesthesia Post-op Instructions, and Care and Recovery After Surgery      Ureteral Stent Implantation, Care After This sheet gives you information about how to care for yourself after your procedure. Your health care provider may also give you more specific instructions. If you have problems or questions, contact your health care provider. What can I expect after the procedure? After the procedure, it is common to have: Nausea. Mild pain when you urinate. You may feel this pain in your lower  back or lower abdomen. The pain should stop within a few minutes after you urinate. This may last for up to 1 week. A small amount of blood in your urine for several days. Follow these instructions at home: Medicines Take over-the-counter and prescription medicines only as told by your health care provider. If you were prescribed an antibiotic medicine, take it as told by your health care provider. Do not stop taking the antibiotic even if you start to feel better. Do not drive for 24 hours if you were given a sedative during your procedure. Ask your health care provider if the medicine prescribed to you requires you to avoid driving or using heavy machinery. Activity Rest as told by your health care provider. Avoid sitting for a long time without moving. Get up to take short walks every 1-2 hours. This is important to improve blood flow and breathing. Ask for help if you feel weak or unsteady. Return to your normal activities as told by your health care provider. Ask your health care provider what activities are safe for you. General instructions  Watch for any blood in your urine. Call your health care provider if the amount of blood in your urine increases. If you have a catheter: Follow instructions from your health care provider about taking  care of your catheter and collection bag. Do not take baths, swim, or use a hot tub until your health care provider approves. Ask your health care provider if you may take showers. You may only be allowed to take sponge baths. Drink enough fluid to keep your urine pale yellow. Do not use any products that contain nicotine or tobacco, such as cigarettes, e-cigarettes, and chewing tobacco. These can delay healing after surgery. If you need help quitting, ask your health care provider. Keep all follow-up visits as told by your health care provider. This is important. Contact a health care provider if: You have pain that gets worse or does not get better  with medicine, especially pain when you urinate. You have difficulty urinating. You feel nauseous or you vomit repeatedly during a period of more than 2 days after the procedure. Get help right away if: Your urine is dark red or has blood clots in it. You are leaking urine (have incontinence). The end of the stent comes out of your urethra. You cannot urinate. You have sudden, sharp, or severe pain in your abdomen or lower back. You have a fever. You have swelling or pain in your legs. You have difficulty breathing. Summary After the procedure, it is common to have mild pain when you urinate that goes away within a few minutes after you urinate. This may last for up to 1 week. Watch for any blood in your urine. Call your health care provider if the amount of blood in your urine increases. Take over-the-counter and prescription medicines only as told by your health care provider. Drink enough fluid to keep your urine pale yellow. This information is not intended to replace advice given to you by your health care provider. Make sure you discuss any questions you have with your health care provider. Document Revised: 06/02/2018 Document Reviewed: 06/03/2018 Elsevier Patient Education  2022 [place] Anesthesia, Adult, Care After This sheet gives you information about how to care for yourself after your procedure. Your health care provider may also give you more specific instructions. If you have problems or questions, contact your health care provider. What can I expect after the procedure? After the procedure, the following side effects are common: Pain or discomfort at the IV site. Nausea. Vomiting. Sore throat. Trouble concentrating. Feeling cold or chills. Feeling weak or tired. Sleepiness and fatigue. Soreness and body aches. These side effects can affect parts of the body that were not involved in surgery. Follow these instructions at home: For the time period you  were told by your health care provider:  Rest. Do not participate in activities where you could fall or become injured. Do not drive or use machinery. Do not drink alcohol. Do not take sleeping pills or medicines that cause drowsiness. Do not make important decisions or sign legal documents. Do not take care of children on your own. Eating and drinking Follow any instructions from your health care provider about eating or drinking restrictions. When you feel hungry, start by eating small amounts of foods that are soft and easy to digest (bland), such as toast. Gradually return to your regular diet. Drink enough fluid to keep your urine pale yellow. If you vomit, rehydrate by drinking water, juice, or clear broth. General instructions If you have sleep apnea, surgery and certain medicines can increase your risk for breathing problems. Follow instructions from your health care provider about wearing your sleep device: Anytime you are sleeping, including during daytime naps. While taking  prescription pain medicines, sleeping medicines, or medicines that make you drowsy. Have a responsible adult stay with you for the time you are told. It is important to have someone help care for you until you are awake and alert. Return to your normal activities as told by your health care provider. Ask your health care provider what activities are safe for you. Take over-the-counter and prescription medicines only as told by your health care provider. If you smoke, do not smoke without supervision. Keep all follow-up visits as told by your health care provider. This is important. Contact a health care provider if: You have nausea or vomiting that does not get better with medicine. You cannot eat or drink without vomiting. You have pain that does not get better with medicine. You are unable to pass urine. You develop a skin rash. You have a fever. You have redness around your IV site that gets worse. Get  help right away if: You have difficulty breathing. You have chest pain. You have blood in your urine or stool, or you vomit blood. Summary After the procedure, it is common to have a sore throat or nausea. It is also common to feel tired. Have a responsible adult stay with you for the time you are told. It is important to have someone help care for you until you are awake and alert. When you feel hungry, start by eating small amounts of foods that are soft and easy to digest (bland), such as toast. Gradually return to your regular diet. Drink enough fluid to keep your urine pale yellow. Return to your normal activities as told by your health care provider. Ask your health care provider what activities are safe for you. This information is not intended to replace advice given to you by your health care provider. Make sure you discuss any questions you have with your health care provider. Document Revised: 05/11/2020 Document Reviewed: 12/09/2019 Elsevier Patient Education  2022 [place]. How to Use Chlorhexidine for Bathing Chlorhexidine gluconate (CHG) is a germ-killing (antiseptic) solution that is used to clean the skin. It can get rid of the bacteria that normally live on the skin and can keep them away for about 24 hours. To clean your skin with CHG, you may be given: A CHG solution to use in the shower or as part of a sponge bath. A prepackaged cloth that contains CHG. Cleaning your skin with CHG may help lower the risk for infection: While you are staying in the intensive care unit of the hospital. If you have a vascular access, such as a central line, to provide short-term or long-term access to your veins. If you have a catheter to drain urine from your bladder. If you are on a ventilator. A ventilator is a machine that helps you breathe by moving air in and out of your lungs. After surgery. What are the risks? Risks of using CHG include: A skin reaction. Hearing loss, if CHG  gets in your ears and you have a perforated eardrum. Eye injury, if CHG gets in your eyes and is not rinsed out. The CHG product catching fire. Make sure that you avoid smoking and flames after applying CHG to your skin. Do not use CHG: If you have a chlorhexidine allergy or have previously reacted to chlorhexidine. On babies younger than 46 months of age. How to use CHG solution Use CHG only as told by your health care provider, and follow the instructions on the label. Use the full amount  of CHG as directed. Usually, this is one bottle. During a shower Follow these steps when using CHG solution during a shower (unless your health care provider gives you different instructions): Start the shower. Use your normal soap and shampoo to wash your face and hair. Turn off the shower or move out of the shower stream. Pour the CHG onto a clean washcloth. Do not use any type of brush or rough-edged sponge. Starting at your neck, lather your body down to your toes. Make sure you follow these instructions: If you will be having surgery, pay special attention to the part of your body where you will be having surgery. Scrub this area for at least 1 minute. Do not use CHG on your head or face. If the solution gets into your ears or eyes, rinse them well with water. Avoid your genital area. Avoid any areas of skin that have broken skin, cuts, or scrapes. Scrub your back and under your arms. Make sure to wash skin folds. Let the lather sit on your skin for 1-2 minutes or as long as told by your health care provider. Thoroughly rinse your entire body in the shower. Make sure that all body creases and crevices are rinsed well. Dry off with a clean towel. Do not put any substances on your body afterward--such as powder, lotion, or perfume--unless you are told to do so by your health care provider. Only use lotions that are recommended by the manufacturer. Put on clean clothes or pajamas. If it is the night  before your surgery, sleep in clean sheets.  During a sponge bath Follow these steps when using CHG solution during a sponge bath (unless your health care provider gives you different instructions): Use your normal soap and shampoo to wash your face and hair. Pour the CHG onto a clean washcloth. Starting at your neck, lather your body down to your toes. Make sure you follow these instructions: If you will be having surgery, pay special attention to the part of your body where you will be having surgery. Scrub this area for at least 1 minute. Do not use CHG on your head or face. If the solution gets into your ears or eyes, rinse them well with water. Avoid your genital area. Avoid any areas of skin that have broken skin, cuts, or scrapes. Scrub your back and under your arms. Make sure to wash skin folds. Let the lather sit on your skin for 1-2 minutes or as long as told by your health care provider. Using a different clean, wet washcloth, thoroughly rinse your entire body. Make sure that all body creases and crevices are rinsed well. Dry off with a clean towel. Do not put any substances on your body afterward--such as powder, lotion, or perfume--unless you are told to do so by your health care provider. Only use lotions that are recommended by the manufacturer. Put on clean clothes or pajamas. If it is the night before your surgery, sleep in clean sheets. How to use CHG prepackaged cloths Only use CHG cloths as told by your health care provider, and follow the instructions on the label. Use the CHG cloth on clean, dry skin. Do not use the CHG cloth on your head or face unless your health care provider tells you to. When washing with the CHG cloth: Avoid your genital area. Avoid any areas of skin that have broken skin, cuts, or scrapes. Before surgery Follow these steps when using a CHG cloth to clean before surgery (unless  your health care provider gives you different instructions): Using  the CHG cloth, vigorously scrub the part of your body where you will be having surgery. Scrub using a back-and-forth motion for 3 minutes. The area on your body should be completely wet with CHG when you are done scrubbing. Do not rinse. Discard the cloth and let the area air-dry. Do not put any substances on the area afterward, such as powder, lotion, or perfume. Put on clean clothes or pajamas. If it is the night before your surgery, sleep in clean sheets.  For general bathing Follow these steps when using CHG cloths for general bathing (unless your health care provider gives you different instructions). Use a separate CHG cloth for each area of your body. Make sure you wash between any folds of skin and between your fingers and toes. Wash your body in the following order, switching to a new cloth after each step: The front of your neck, shoulders, and chest. Both of your arms, under your arms, and your hands. Your stomach and groin area, avoiding the genitals. Your right leg and foot. Your left leg and foot. The back of your neck, your back, and your buttocks. Do not rinse. Discard the cloth and let the area air-dry. Do not put any substances on your body afterward--such as powder, lotion, or perfume--unless you are told to do so by your health care provider. Only use lotions that are recommended by the manufacturer. Put on clean clothes or pajamas. Contact a health care provider if: Your skin gets irritated after scrubbing. You have questions about using your solution or cloth. You swallow any chlorhexidine. Call your local poison control center (1-509-009-3691 in the [place] Get help right away if: Your eyes itch badly, or they become very red or swollen. Your skin itches badly and is red or swollen. Your hearing changes. You have trouble seeing. You have swelling or tingling in your mouth or throat. You have trouble breathing. These symptoms may represent a serious problem that is an  emergency. Do not wait to see if the symptoms will go away. Get medical help right away. Call your local emergency services (911 in the [place] Do not drive yourself to the hospital. Summary Chlorhexidine gluconate (CHG) is a germ-killing (antiseptic) solution that is used to clean the skin. Cleaning your skin with CHG may help to lower your risk for infection. You may be given CHG to use for bathing. It may be in a bottle or in a prepackaged cloth to use on your skin. Carefully follow your health care provider's instructions and the instructions on the product label. Do not use CHG if you have a chlorhexidine allergy. Contact your health care provider if your skin gets irritated after scrubbing. This information is not intended to replace advice given to you by your health care provider. Make sure you discuss any questions you have with your health care provider. Document Revised: 11/06/2020 Document Reviewed: 11/06/2020 Elsevier Patient Education  2022 [employer].

## 2021-06-03 ENCOUNTER — Other Ambulatory Visit: Payer: Self-pay | Admitting: Urology

## 2021-06-05 ENCOUNTER — Other Ambulatory Visit: Payer: Self-pay

## 2021-06-05 ENCOUNTER — Encounter (HOSPITAL_COMMUNITY)
Admission: RE | Admit: 2021-06-05 | Discharge: 2021-06-05 | Disposition: A | Discharge status: Home / Self Care | Payer: Medicare HMO | Source: Ambulatory Visit | Attending: Urology | Admitting: Urology

## 2021-06-05 ENCOUNTER — Encounter (HOSPITAL_COMMUNITY): Payer: Self-pay

## 2021-06-05 VITALS — BP 133/62 | HR 97 | Temp 98.3°F | Resp 16 | Ht 65.0 in | Wt 203.0 lb

## 2021-06-05 DIAGNOSIS — E0810 Diabetes mellitus due to underlying condition with ketoacidosis without coma: Secondary | ICD-10-CM

## 2021-06-05 DIAGNOSIS — E1110 Type 2 diabetes mellitus with ketoacidosis without coma: Secondary | ICD-10-CM | POA: Diagnosis not present

## 2021-06-05 DIAGNOSIS — Z01818 Encounter for other preprocedural examination: Secondary | ICD-10-CM | POA: Insufficient documentation

## 2021-06-06 LAB — HEMOGLOBIN A1C
Hgb A1c MFr Bld: 6.5 % — ABNORMAL HIGH (ref 4.8–5.6)
Mean Plasma Glucose: 140 mg/dL

## 2021-06-07 ENCOUNTER — Encounter (HOSPITAL_COMMUNITY): Payer: Self-pay | Admitting: Urology

## 2021-06-07 ENCOUNTER — Encounter (HOSPITAL_COMMUNITY)
Admission: RE | Disposition: A | Discharge status: Home / Self Care | Payer: Self-pay | Source: Home / Self Care | Attending: Urology

## 2021-06-07 ENCOUNTER — Ambulatory Visit (HOSPITAL_COMMUNITY): Payer: Medicare HMO

## 2021-06-07 ENCOUNTER — Ambulatory Visit (HOSPITAL_COMMUNITY): Payer: Medicare HMO | Admitting: Anesthesiology

## 2021-06-07 ENCOUNTER — Other Ambulatory Visit: Payer: Self-pay

## 2021-06-07 ENCOUNTER — Ambulatory Visit (HOSPITAL_COMMUNITY)
Admission: RE | Admit: 2021-06-07 | Discharge: 2021-06-07 | Disposition: A | Discharge status: Home / Self Care | Payer: Medicare HMO | Attending: Urology | Admitting: Urology

## 2021-06-07 DIAGNOSIS — N132 Hydronephrosis with renal and ureteral calculous obstruction: Secondary | ICD-10-CM | POA: Insufficient documentation

## 2021-06-07 DIAGNOSIS — N201 Calculus of ureter: Secondary | ICD-10-CM | POA: Diagnosis not present

## 2021-06-07 DIAGNOSIS — Z87442 Personal history of urinary calculi: Secondary | ICD-10-CM | POA: Insufficient documentation

## 2021-06-07 DIAGNOSIS — Z7984 Long term (current) use of oral hypoglycemic drugs: Secondary | ICD-10-CM | POA: Diagnosis not present

## 2021-06-07 DIAGNOSIS — Z79899 Other long term (current) drug therapy: Secondary | ICD-10-CM | POA: Diagnosis not present

## 2021-06-07 DIAGNOSIS — E119 Type 2 diabetes mellitus without complications: Secondary | ICD-10-CM | POA: Diagnosis not present

## 2021-06-07 DIAGNOSIS — Z7982 Long term (current) use of aspirin: Secondary | ICD-10-CM | POA: Diagnosis not present

## 2021-06-07 HISTORY — PX: HOLMIUM LASER APPLICATION: SHX5852

## 2021-06-07 HISTORY — PX: CYSTOSCOPY WITH RETROGRADE PYELOGRAM, URETEROSCOPY AND STENT PLACEMENT: SHX5789

## 2021-06-07 LAB — GLUCOSE, CAPILLARY
Glucose-Capillary: 148 mg/dL — ABNORMAL HIGH (ref 70–99)
Glucose-Capillary: 127 mg/dL — ABNORMAL HIGH (ref 70–99)

## 2021-06-07 SURGERY — CYSTOURETEROSCOPY, WITH RETROGRADE PYELOGRAM AND STENT INSERTION
Anesthesia: General | Site: Ureter | Laterality: Right

## 2021-06-07 MED ORDER — DIATRIZOATE MEGLUMINE 30 % UR SOLN
URETHRAL | Status: DC | PRN
  Administered 2021-06-07: 5 mL via URETHRAL

## 2021-06-07 MED ORDER — ONDANSETRON HCL 4 MG/2ML IJ SOLN: 4.0000 mg | Freq: Once | INTRAMUSCULAR | Status: DC | PRN

## 2021-06-07 MED ORDER — WATER FOR IRRIGATION, STERILE IR SOLN
Status: DC | PRN
  Administered 2021-06-07: 1000 mL

## 2021-06-07 MED ORDER — SODIUM CHLORIDE 0.9 % IR SOLN
Status: DC | PRN
  Administered 2021-06-07: 3000 mL

## 2021-06-07 MED ORDER — ONDANSETRON HCL 4 MG/2ML IJ SOLN
INTRAMUSCULAR | Status: DC | PRN
  Administered 2021-06-07: 4 mg via INTRAVENOUS

## 2021-06-07 MED ORDER — PHENYLEPHRINE HCL (PRESSORS) 10 MG/ML IV SOLN
INTRAVENOUS | Status: DC | PRN
Start: 2021-06-07 — End: 2021-06-07
  Administered 2021-06-07: 80 ug via INTRAVENOUS
  Administered 2021-06-07: 40 ug via INTRAVENOUS
  Administered 2021-06-07: 80 ug via INTRAVENOUS
  Administered 2021-06-07: 100 ug via INTRAVENOUS
  Administered 2021-06-07: 80 ug via INTRAVENOUS
  Administered 2021-06-07 (×3): 100 ug via INTRAVENOUS
  Administered 2021-06-07: 80 ug via INTRAVENOUS
  Administered 2021-06-07: 200 ug via INTRAVENOUS

## 2021-06-07 MED ORDER — OXYCODONE-ACETAMINOPHEN 5-325 MG PO TABS: 1.0000 | ORAL_TABLET | ORAL | 0 refills | Status: DC | PRN

## 2021-06-07 MED ORDER — LIDOCAINE HCL (CARDIAC) PF 100 MG/5ML IV SOSY
PREFILLED_SYRINGE | INTRAVENOUS | Status: DC | PRN
  Administered 2021-06-07: 80 mg via INTRAVENOUS

## 2021-06-07 MED ORDER — FENTANYL CITRATE (PF) 100 MCG/2ML IJ SOLN
INTRAMUSCULAR | Status: AC
  Filled 2021-06-07: qty 2

## 2021-06-07 MED ORDER — LIDOCAINE HCL (PF) 2 % IJ SOLN
INTRAMUSCULAR | Status: AC
  Filled 2021-06-07: qty 5

## 2021-06-07 MED ORDER — CHLORHEXIDINE GLUCONATE 0.12 % MT SOLN
15.0000 mL | Freq: Once | OROMUCOSAL | Status: AC
  Administered 2021-06-07: 15 mL via OROMUCOSAL
  Filled 2021-06-07: qty 15

## 2021-06-07 MED ORDER — ONDANSETRON HCL 4 MG PO TABS: 4.0000 mg | ORAL_TABLET | Freq: Every day | ORAL | 1 refills | Status: AC | PRN

## 2021-06-07 MED ORDER — FENTANYL CITRATE (PF) 250 MCG/5ML IJ SOLN
INTRAMUSCULAR | Status: DC | PRN
  Administered 2021-06-07 (×6): 25 ug via INTRAVENOUS

## 2021-06-07 MED ORDER — PHENYLEPHRINE HCL (PRESSORS) 10 MG/ML IV SOLN
INTRAVENOUS | Status: AC
  Filled 2021-06-07: qty 1

## 2021-06-07 MED ORDER — DIATRIZOATE MEGLUMINE 30 % UR SOLN
URETHRAL | Status: AC
  Filled 2021-06-07: qty 100

## 2021-06-07 MED ORDER — PHENYLEPHRINE 40 MCG/ML (10ML) SYRINGE FOR IV PUSH (FOR BLOOD PRESSURE SUPPORT)
PREFILLED_SYRINGE | INTRAVENOUS | Status: AC
  Filled 2021-06-07: qty 20

## 2021-06-07 MED ORDER — PROPOFOL 10 MG/ML IV BOLUS
INTRAVENOUS | Status: DC | PRN
  Administered 2021-06-07: 100 mg via INTRAVENOUS
  Administered 2021-06-07: 200 mg via INTRAVENOUS

## 2021-06-07 MED ORDER — LACTATED RINGERS IV SOLN: INTRAVENOUS | Status: DC

## 2021-06-07 MED ORDER — CEFAZOLIN SODIUM-DEXTROSE 2-4 GM/100ML-% IV SOLN
2.0000 g | INTRAVENOUS | Status: AC
  Administered 2021-06-07: 2 g via INTRAVENOUS
  Filled 2021-06-07: qty 100

## 2021-06-07 MED ORDER — EPHEDRINE 5 MG/ML INJ
INTRAVENOUS | Status: AC
  Filled 2021-06-07: qty 5

## 2021-06-07 MED ORDER — TAMSULOSIN HCL 0.4 MG PO CAPS: ORAL_CAPSULE | ORAL | 0 refills | Status: DC

## 2021-06-07 MED ORDER — FENTANYL CITRATE PF 50 MCG/ML IJ SOSY: 25.0000 ug | PREFILLED_SYRINGE | INTRAMUSCULAR | Status: DC | PRN

## 2021-06-07 MED ORDER — PROPOFOL 10 MG/ML IV BOLUS
INTRAVENOUS | Status: AC
  Filled 2021-06-07: qty 40

## 2021-06-07 MED ORDER — ONDANSETRON HCL 4 MG/2ML IJ SOLN
INTRAMUSCULAR | Status: AC
  Filled 2021-06-07: qty 2

## 2021-06-07 MED ORDER — ORAL CARE MOUTH RINSE: 15.0000 mL | Freq: Once | OROMUCOSAL | Status: AC

## 2021-06-07 MED ORDER — EPHEDRINE SULFATE 50 MG/ML IJ SOLN
INTRAMUSCULAR | Status: DC | PRN
  Administered 2021-06-07: 5 mg via INTRAVENOUS
  Administered 2021-06-07 (×2): 10 mg via INTRAVENOUS

## 2021-06-07 SURGICAL SUPPLY — 28 items
BAG DRAIN URO TABLE W/ADPT NS (BAG) ×2 IMPLANT
BAG DRN 8 ADPR NS SKTRN CSTL (BAG) ×1
BAG HAMPER (MISCELLANEOUS) ×2 IMPLANT
CATH INTERMIT  6FR 70CM (CATHETERS) ×2 IMPLANT
CLOTH BEACON ORANGE TIMEOUT ST (SAFETY) ×2 IMPLANT
DECANTER SPIKE VIAL GLASS SM (MISCELLANEOUS) ×2 IMPLANT
EXTRACTOR STONE NITINOL NGAGE (UROLOGICAL SUPPLIES) ×2 IMPLANT
GLOVE SURG POLYISO LF SZ8 (GLOVE) ×2 IMPLANT
GLOVE SURG UNDER POLY LF SZ7 (GLOVE) ×4 IMPLANT
GOWN STRL REUS W/TWL LRG LVL3 (GOWN DISPOSABLE) ×2 IMPLANT
GOWN STRL REUS W/TWL XL LVL3 (GOWN DISPOSABLE) ×2 IMPLANT
GUIDEWIRE STR DUAL SENSOR (WIRE) ×2 IMPLANT
GUIDEWIRE STR ZIPWIRE 035X150 (MISCELLANEOUS) ×2 IMPLANT
IV NS IRRIG 3000ML ARTHROMATIC (IV SOLUTION) ×4 IMPLANT
KIT TURNOVER CYSTO (KITS) ×2 IMPLANT
MANIFOLD NEPTUNE II (INSTRUMENTS) ×2 IMPLANT
PACK CYSTO (CUSTOM PROCEDURE TRAY) ×2 IMPLANT
PAD ARMBOARD 7.5X6 YLW CONV (MISCELLANEOUS) ×2 IMPLANT
SHEATH URETERAL 12FRX35CM (MISCELLANEOUS) IMPLANT
STENT URET 6FRX26 CONTOUR (STENTS) ×2 IMPLANT
SYR 10ML LL (SYRINGE) ×2 IMPLANT
SYR CONTROL 10ML LL (SYRINGE) ×2 IMPLANT
SYR TOOMEY IRRIG 70ML (MISCELLANEOUS) ×2
SYRINGE TOOMEY IRRIG 70ML (MISCELLANEOUS) ×1 IMPLANT
TOWEL OR 17X26 4PK STRL BLUE (TOWEL DISPOSABLE) ×2 IMPLANT
TRACTIP FLEXIVA PULS ID 200XHI (Laser) ×1 IMPLANT
TRACTIP FLEXIVA PULSE ID 200 (Laser) ×2
WATER STERILE IRR 500ML POUR (IV SOLUTION) ×2 IMPLANT

## 2021-06-07 NOTE — Interval H&P Note (Signed)
History and Physical Interval Note:  06/07/2021 10:28 AM  William Washington  has presented today for surgery, with the diagnosis of right ureteral calculus.  The various methods of treatment have been discussed with the patient and family. After consideration of risks, benefits and other options for treatment, the patient has consented to  Procedure(s): CYSTOSCOPY WITH RETROGRADE PYELOGRAM, URETEROSCOPY AND STENT PLACEMENT (Right) HOLMIUM LASER APPLICATION (Right) as a surgical intervention.  The patient's history has been reviewed, patient examined, no change in status, stable for surgery.  I have reviewed the patient's chart and labs.  Questions were answered to the patient's satisfaction.     [name]

## 2021-06-07 NOTE — Anesthesia Postprocedure Evaluation (Signed)
Anesthesia Post Note  Patient: William Washington  Procedure(s) Performed: CYSTOSCOPY WITH RETROGRADE PYELOGRAM, URETEROSCOPY AND STENT PLACEMENT (Right: Ureter) HOLMIUM LASER APPLICATION (Right: Ureter)  Patient location during evaluation: PACU Anesthesia Type: General Level of consciousness: awake and alert and oriented Pain management: pain level controlled Vital Signs Assessment: post-procedure vital signs reviewed and stable Respiratory status: spontaneous breathing and respiratory function stable Cardiovascular status: blood pressure returned to baseline and stable Postop Assessment: no apparent nausea or vomiting Anesthetic complications: no   No notable events documented.   Last Vitals:  Vitals:   06/07/21 1215 06/07/21 1245  BP: 134/68 139/76  Pulse: 86 86  Resp: 13 18  Temp:  36.6 C  SpO2: 98% 96%    Last Pain:  Vitals:   06/07/21 1245  TempSrc: Oral  PainSc: 0-No pain                 [name] Sidnee Gambrill

## 2021-06-07 NOTE — Anesthesia Preprocedure Evaluation (Signed)
Anesthesia Evaluation  Patient identified by MRN, date of birth, ID band Patient awake    Reviewed: Allergy & Precautions, NPO status , Patient's Chart, lab work & pertinent test results  History of Anesthesia Complications Negative for: history of anesthetic complications  Airway Mallampati: II  TM Distance: >3 FB Neck ROM: Full    Dental  (+) Dental Advisory Given, Missing, Caps Crowns:   Pulmonary shortness of breath and with exertion, sleep apnea and Continuous Positive Airway Pressure Ventilation ,    Pulmonary exam normal breath sounds clear to auscultation       Cardiovascular Exercise Tolerance: Good hypertension, Pt. on medications  Rhythm:Regular Rate:Normal + Systolic murmurs [id number] 11:48:19 [place] [facility]-AP-300 ROUTINE RECORD November 12, 1945 (51 yr) Male Caucasian Vent. rate 93 BPM PR interval 174 ms QRS duration 124 ms QT/QTcB 386/479 ms P-R-T axes 68 258 70 Normal sinus rhythm Right superior axis deviation Anteroseptal infarct , age undetermined Abnormal ECG   Neuro/Psych negative neurological ROS  negative psych ROS   GI/Hepatic Neg liver ROS, GERD  Controlled,  Endo/Other  diabetes, Well Controlled, Type 2, Oral Hypoglycemic Agents  Renal/GU Renal disease (stones)     Musculoskeletal negative musculoskeletal ROS (+)   Abdominal   Peds  Hematology  (+) anemia ,   Anesthesia Other Findings   Reproductive/Obstetrics negative OB ROS                            Anesthesia Physical Anesthesia Plan  ASA: 3  Anesthesia Plan: General   Post-op Pain Management:    Induction: Intravenous  PONV Risk Score and Plan: 4 or greater and Ondansetron  Airway Management Planned: LMA  Additional Equipment:   Intra-op Plan:   Post-operative Plan: Extubation in OR  Informed Consent: I have reviewed the patients History and Physical, chart, labs and discussed  the procedure including the risks, benefits and alternatives for the proposed anesthesia with the patient or authorized representative who has indicated his/her understanding and acceptance.     Dental advisory given  Plan Discussed with: CRNA and Surgeon  Anesthesia Plan Comments:        Anesthesia Quick Evaluation

## 2021-06-07 NOTE — Transfer of Care (Signed)
Immediate Anesthesia Transfer of Care Note  Patient: William Washington  Procedure(s) Performed: CYSTOSCOPY WITH RETROGRADE PYELOGRAM, URETEROSCOPY AND STENT PLACEMENT (Right: Ureter) HOLMIUM LASER APPLICATION (Right: Ureter)  Patient Location: PACU  Anesthesia Type:General  Level of Consciousness: awake and alert   Airway & Oxygen Therapy: Patient Spontanous Breathing and Patient connected to nasal cannula oxygen  Post-op Assessment: Report given to RN and Post -op Vital signs reviewed and stable  Post vital signs: Reviewed and stable  Last Vitals:  Vitals Value Taken Time  BP 126/68   Temp    Pulse 85   Resp 14   SpO2 97%     Last Pain:  Vitals:   06/07/21 0929  TempSrc: Oral  PainSc: 0-No pain         Complications: No notable events documented.

## 2021-06-07 NOTE — Anesthesia Procedure Notes (Signed)
Procedure Name: LMA Insertion Date/Time: 06/07/2021 10:56 AM Performed by: Karna Dupes, CRNA Pre-anesthesia Checklist: Patient identified, Emergency Drugs available, Suction available and Patient being monitored Patient Re-evaluated:Patient Re-evaluated prior to induction Oxygen Delivery Method: Circle system utilized Preoxygenation: Pre-oxygenation with 100% oxygen Induction Type: IV induction LMA: LMA inserted LMA Size: 4.0 Number of attempts: 1 Placement Confirmation: positive ETCO2 and breath sounds checked- equal and bilateral Tube secured with: Tape Dental Injury: Teeth and Oropharynx as per pre-operative assessment

## 2021-06-07 NOTE — Op Note (Signed)
Preoperative diagnosis: Right ureteral calculi  Postoperative diagnosis: Same  Procedure: 1 cystoscopy 2 right retrograde pyelography 3.  Intraoperative fluoroscopy, under one hour, with interpretation 4.  Right ureteroscopic stone manipulation with laser lithotripsy 5.  Right 6 x 26 JJ stent placement  Attending: Rosie Fate  Anesthesia: General  Estimated blood loss: None  Drains: Right 6 x 26 JJ ureteral stent without tether  Specimens: stone for analysis  Antibiotics: ancef  Findings: numerous distal and mid ureteral calculi. Moderate hydroureteronephrosis.  Indications: Patient is a 75 year old male with a history of ureteral stone and who has failed medical expulsive therapy after ESWL.  After discussing treatment options, he decided proceed with right ureteroscopic stone manipulation.  Procedure in detail: The patient was brought to the operating room and a brief timeout was done to ensure correct patient, correct procedure, correct site.  General anesthesia was administered patient was placed in dorsal lithotomy position.  Her genitalia was then prepped and draped in usual sterile fashion.  A rigid 33 French cystoscope was passed in the urethra and the bladder.  Bladder was inspected free masses or lesions.  the right ureteral orifices were in the normal orthotopic locations.  a 6 french ureteral catheter was then instilled into the right ureter orifice.  a gentle retrograde was obtained and findings noted above.  we then placed a zip wire through the ureteral catheter and advanced up to the renal pelvis.  we then removed the cystoscope and cannulated the right ureteral orifice with a semirigid ureteroscope.  we then encountered the stone in the distal and mid ureter.  using a 242 nm laser fiber and fragmented the stone into smaller pieces.  the pieces were then removed with a Ngage basket.   once all stone fragments were removed we then placed a 6 x 26 double-j ureteral stent  over the original zip wire. We then removed the wire and good coil was noted in the the renal pelvis under fluoroscopy and the bladder under direct vision.  the stone fragments were then removed from the bladder and sent for analysis.   the bladder was then drained and this concluded the procedure which was well tolerated by patient.  Complications: None  Condition: Stable, extubated, transferred to PACU  Plan: Patient is to be discharged home as to follow-up in one week for stent removal.

## 2021-06-08 ENCOUNTER — Encounter (HOSPITAL_COMMUNITY): Payer: Self-pay | Admitting: Urology

## 2021-06-12 ENCOUNTER — Encounter: Payer: Self-pay | Admitting: Gastroenterology

## 2021-06-12 LAB — CALCULI, WITH PHOTOGRAPH (CLINICAL LAB)
Calcium Oxalate Dihydrate: 10 %
Calcium Oxalate Monohydrate: 90 %
Weight Calculi: 74 mg

## 2021-06-12 NOTE — Progress Notes (Deleted)
Referring Provider: Asencion Noble, MD Primary Care Physician:  Asencion Noble, MD Primary GI Physician: Dr. Abbey Chatters  No chief complaint on file.   HPI:   William Washington is a 75 y.o. male with history of microcytic anemia in the setting of thalassemia trait and iron deficiency following with hematology, adenomatous colon polyps due for surveillance colonoscopy in October 2026 presenting today for follow-up of GERD and halitosis.  He was last seen in our office for the same on 02/14/2021.  He noted 44-month history of progressively worsening "bad breath" as well as a sour taste in his mouth.  Had seen a dentist who did not feel dentition was contributing.  Denied typical heartburn symptoms, dysphagia, odynophagia.  Suspected symptoms were secondary to GERD and he was started on omeprazole 20 mg twice daily x8-12 weeks with plans to decrease to once daily thereafter.  If no improvement, consider EGD.  Today:    Past Medical History:  Diagnosis Date   Diabetes mellitus without complication ([place])    Hypercholesteremia    Hypertension    Sleep apnea     Past Surgical History:  Procedure Laterality Date   COLONOSCOPY WITH PROPOFOL N/A 07/07/2020   Procedure: COLONOSCOPY WITH PROPOFOL;  Surgeon: Eloise Harman, DO;  [facility]: [facility];  Service: Endoscopy;  Laterality: N/A;  9:00   CYSTOSCOPY WITH RETROGRADE PYELOGRAM, URETEROSCOPY AND STENT PLACEMENT Right 06/07/2021   Procedure: CYSTOSCOPY WITH RETROGRADE PYELOGRAM, URETEROSCOPY AND STENT PLACEMENT;  Surgeon: Cleon Gustin, MD;  Location: [facility];  Service: Urology;  Laterality: Right;   EXTRACORPOREAL SHOCK WAVE LITHOTRIPSY Right 05/08/2021   Procedure: EXTRACORPOREAL SHOCK WAVE LITHOTRIPSY (ESWL);  Surgeon: Cleon Gustin, MD;  Location: [facility];  Service: Urology;  Laterality: Right;   HOLMIUM LASER APPLICATION Right 5/62/5638   Procedure: HOLMIUM LASER APPLICATION;  Surgeon: Cleon Gustin, MD;  Location: [facility];   Service: Urology;  Laterality: Right;   MENISCECTOMY Left    ORIF FEMUR FRACTURE Left    POLYPECTOMY  07/07/2020   Procedure: POLYPECTOMY;  Surgeon: Eloise Harman, DO;  Location: [facility];  Service: Endoscopy;;    Current Outpatient Medications  Medication Sig Dispense Refill   aspirin EC 81 MG tablet Take 81 mg by mouth daily. Swallow whole.     atorvastatin (LIPITOR) 40 MG tablet Take 40 mg by mouth at bedtime.      Cholecalciferol (EQL VITAMIN D3) 50 MCG (2000 UT) CAPS Take 2,000 Units by mouth daily.     diclofenac Sodium (VOLTAREN) 1 % GEL Apply 2 g topically daily.     hydroxypropyl methylcellulose / hypromellose (ISOPTO TEARS / GONIOVISC) 2.5 % ophthalmic solution Place 1 drop into both eyes 3 (three) times daily as needed for dry eyes.     loratadine-pseudoephedrine (CLARITIN-D 24-HOUR) 10-240 MG 24 hr tablet Take 1 tablet by mouth every other day.      Omega-3 Fatty Acids (FISH OIL) 1000 MG CAPS Take 2,000 mg by mouth daily.     omeprazole (PRILOSEC) 20 MG capsule Take 1 capsule (20 mg total) by mouth 2 (two) times daily before a meal. 60 capsule 5   ondansetron (ZOFRAN) 4 MG tablet Take 1 tablet (4 mg total) by mouth daily as needed for nausea or vomiting. 30 tablet 1   oxybutynin (DITROPAN) 5 MG tablet Take 1 tablet (5 mg total) by mouth every 8 (eight) hours as needed for up to 15 doses (Urinary frequency/urgency). (Patient not taking: Reported on 06/01/2021) 60  tablet 11   oxyCODONE-acetaminophen (PERCOCET) 5-325 MG tablet Take 1 tablet by mouth every 4 (four) hours as needed for severe pain. 30 tablet 0   pioglitazone (ACTOS) 30 MG tablet Take 30 mg by mouth daily.     ramipril (ALTACE) 5 MG capsule Take 5 mg by mouth daily.     sodium chloride (OCEAN) 0.65 % SOLN nasal spray Place 1 spray into both nostrils as needed for congestion.     tamsulosin (FLOMAX) 0.4 MG CAPS capsule TAKE 1 CAPSULE BY MOUTH EVERY DAY AFTER SUPPER 30 capsule 0   TRESIBA FLEXTOUCH 100 UNIT/ML  FlexTouch Pen Inject 25 Units into the skin at bedtime.     Turmeric 500 MG CAPS Take 1,000 mg by mouth daily.      vitamin B-12 (CYANOCOBALAMIN) 1000 MCG tablet Take 1,000 mcg by mouth daily.     No current facility-administered medications for this visit.    Allergies as of 06/13/2021   (No Known Allergies)    Family History  Problem Relation Age of Onset   Severe sprains Neg Hx    Scoliosis Neg Hx     Social History   Socioeconomic History   Marital status: Significant Other    Spouse name: Not on file   Number of children: Not on file   Years of education: Not on file   Highest education level: Not on file  Occupational History   Not on file  Tobacco Use   Smoking status: Never   Smokeless tobacco: Never  Vaping Use   Vaping Use: Never used  Substance and Sexual Activity   Alcohol use: Yes    Comment: rare   Drug use: Never   Sexual activity: Not on file  Other Topics Concern   Not on file  Social History Narrative   Not on file   Social Determinants of Health   Financial Resource Strain: Low Risk    Difficulty of Paying Living Expenses: Not hard at all  Food Insecurity: No Food Insecurity   Worried About Charity fundraiser in the Last Year: Never true   Ran Out of Food in the Last Year: Never true  Transportation Needs: No Transportation Needs   Lack of Transportation (Medical): No   Lack of Transportation (Non-Medical): No  Physical Activity: Inactive   Days of Exercise per Week: 0 days   Minutes of Exercise per Session: 0 min  Stress: No Stress Concern Present   Feeling of Stress : Not at all  Social Connections: Moderately Isolated   Frequency of Communication with Friends and Family: More than three times a week   Frequency of Social Gatherings with Friends and Family: More than three times a week   Attends Religious Services: Never   Marine scientist or Organizations: No   Attends Archivist Meetings: Never   Marital Status:  Living with partner    Review of Systems: Gen: Denies fever, chills, anorexia. Denies fatigue, weakness, weight loss.  CV: Denies chest pain, palpitations, syncope, peripheral edema, and claudication. Resp: Denies dyspnea at rest, cough, wheezing, coughing up blood, and pleurisy. GI: Denies vomiting blood, jaundice, and fecal incontinence.   Denies dysphagia or odynophagia. Derm: Denies rash, itching, dry skin Psych: Denies depression, anxiety, memory loss, confusion. No homicidal or suicidal ideation.  Heme: Denies bruising, bleeding, and enlarged lymph nodes.  Physical Exam: There were no vitals taken for this visit. General:   Alert and oriented. No distress noted. Pleasant and cooperative.  Head:  Normocephalic and atraumatic. Eyes:  Conjuctiva clear without scleral icterus. Mouth:  Oral mucosa pink and moist. Good dentition. No lesions. Heart:  S1, S2 present without murmurs appreciated. Lungs:  Clear to auscultation bilaterally. No wheezes, rales, or rhonchi. No distress.  Abdomen:  +BS, soft, non-tender and non-distended. No rebound or guarding. No HSM or masses noted. Msk:  Symmetrical without gross deformities. Normal posture. Extremities:  Without edema. Neurologic:  Alert and  oriented x4 Psych:  Alert and cooperative. Normal mood and affect.

## 2021-06-13 ENCOUNTER — Ambulatory Visit: Payer: Medicare HMO | Admitting: Gastroenterology

## 2021-06-13 ENCOUNTER — Other Ambulatory Visit: Payer: Self-pay

## 2021-06-13 ENCOUNTER — Encounter: Payer: Self-pay | Admitting: Urology

## 2021-06-13 ENCOUNTER — Encounter: Payer: Self-pay | Admitting: Internal Medicine

## 2021-06-13 ENCOUNTER — Ambulatory Visit (INDEPENDENT_AMBULATORY_CARE_PROVIDER_SITE_OTHER): Payer: Medicare HMO | Admitting: Urology

## 2021-06-13 VITALS — BP 128/69 | HR 80 | Ht 65.0 in | Wt 206.0 lb

## 2021-06-13 DIAGNOSIS — N201 Calculus of ureter: Secondary | ICD-10-CM | POA: Diagnosis not present

## 2021-06-13 LAB — URINALYSIS, ROUTINE W REFLEX MICROSCOPIC
Bilirubin, UA: NEGATIVE
Glucose, UA: NEGATIVE
Ketones, UA: NEGATIVE
Nitrite, UA: NEGATIVE
Specific Gravity, UA: 1.02 (ref 1.005–1.030)
Urobilinogen, Ur: 0.2 mg/dL (ref 0.2–1.0)
pH, UA: 5 (ref 5.0–7.5)

## 2021-06-13 LAB — MICROSCOPIC EXAMINATION
RBC, Urine: >30 /hpf — AB (ref 0–2)
Renal Epithel, UA: NONE SEEN /hpf

## 2021-06-13 MED ORDER — CIPROFLOXACIN HCL 500 MG PO TABS
500.0000 mg | ORAL_TABLET | Freq: Once | ORAL | Status: AC
  Administered 2021-06-13: 500 mg via ORAL

## 2021-06-13 NOTE — Progress Notes (Signed)
   06/13/21  CC: followup ureteroscopy   HPI: William Washington is a 75yo here for right ureteral stent removal Blood pressure 128/69, pulse 80, height 5\' 5"  (1.651 m), weight 206 lb (93.4 kg). NED. A&Ox3.   No respiratory distress   Abd soft, NT, ND Normal phallus with bilateral descended testicles  Cystoscopy Procedure Note  Patient identification was confirmed, informed consent was obtained, and patient was prepped using Betadine solution.  Lidocaine jelly was administered per urethral meatus.     Pre-Procedure: - Inspection reveals a normal caliber ureteral meatus.  Procedure: The flexible cystoscope was introduced without difficulty - No urethral strictures/lesions are present. - Enlarged prostate  - Elevated bladder neck - Bilateral ureteral orifices identified - Bladder mucosa  reveals no ulcers, tumors, or lesions - No bladder stones - No trabeculation  Using a grasper the right ureteral stent was removed intact   Post-Procedure: - Patient tolerated the procedure well  Assessment/ Plan:   Return in about 6 weeks (around 07/25/2021) for renal US.  [name], MD

## 2021-06-13 NOTE — Progress Notes (Signed)
Urological Symptom Review  Patient is experiencing the following symptoms: Frequent urination Burning/pain with urination Erection problems (male only) Kidney stones   Review of Systems  Gastrointestinal (upper)  : Negative for upper GI symptoms  Gastrointestinal (lower) : Negative for lower GI symptoms  Constitutional : Negative for symptoms  Skin: Negative for skin symptoms  Eyes: Negative for eye symptoms  Ear/Nose/Throat : Sinus problems  Hematologic/Lymphatic: Negative for Hematologic/Lymphatic symptoms  Cardiovascular : Negative for cardiovascular symptoms  Respiratory : Negative for respiratory symptoms  Endocrine: Negative for endocrine symptoms  Musculoskeletal: Back pain Joint pain  Neurological: Negative for neurological symptoms  Psychologic: Negative for psychiatric symptoms

## 2021-06-13 NOTE — Patient Instructions (Signed)
Dietary Guidelines to Help Prevent Kidney Stones Kidney stones are deposits of minerals and salts that form inside your kidneys. Your risk of developing kidney stones may be greater depending on your diet, your lifestyle, the medicines you take, and whether you have certain medical conditions. Most people can lower their chances of developing kidney stones by following the instructions below. Your dietitian may give you more specific instructions depending on your overall health and the type of kidney stones you tend to develop. What are tips for following this plan? Reading food labels  Choose foods with "no salt added" or "low-salt" labels. Limit your salt (sodium) intake to less than 1,500 mg a day. Choose foods with calcium for each meal and snack. Try to eat about 300 mg of calcium at each meal. Foods that contain 200-500 mg of calcium a serving include: 8 oz (237 mL) of milk, calcium-fortifiednon-dairy milk, and calcium-fortifiedfruit juice. Calcium-fortified means that calcium has been added to these drinks. 8 oz (237 mL) of kefir, yogurt, and soy yogurt. 4 oz (114 g) of tofu. 1 oz (28 g) of cheese. 1 cup (150 g) of dried figs. 1 cup (91 g) of cooked broccoli. One 3 oz (85 g) can of sardines or mackerel. Most people need 1,000-1,500 mg of calcium a day. Talk to your dietitian about how much calcium is recommended for you. Shopping Buy plenty of fresh fruits and vegetables. Most people do not need to avoid fruits and vegetables, even if these foods contain nutrients that may contribute to kidney stones. When shopping for convenience foods, choose: Whole pieces of fruit. Pre-made salads with dressing on the side. Low-fat fruit and yogurt smoothies. Avoid buying frozen meals or prepared deli foods. These can be high in sodium. Look for foods with live cultures, such as yogurt and kefir. Choose high-fiber grains, such as whole-wheat breads, oat bran, and wheat cereals. Cooking Do not add  salt to food when cooking. Place a salt shaker on the table and allow each person to add his or her own salt to taste. Use vegetable protein, such as beans, textured vegetable protein (TVP), or tofu, instead of meat in pasta, casseroles, and soups. Meal planning Eat less salt, if told by your dietitian. To do this: Avoid eating processed or pre-made food. Avoid eating fast food. Eat less animal protein, including cheese, meat, poultry, or fish, if told by your dietitian. To do this: Limit the number of times you have meat, poultry, fish, or cheese each week. Eat a diet free of meat at least 2 days a week. Eat only one serving each day of meat, poultry, fish, or seafood. When you prepare animal protein, cut pieces into small portion sizes. For most meat and fish, one serving is about the size of the palm of your hand. Eat at least five servings of fresh fruits and vegetables each day. To do this: Keep fruits and vegetables on hand for snacks. Eat one piece of fruit or a handful of berries with breakfast. Have a salad and fruit at lunch. Have two kinds of vegetables at dinner. Limit foods that are high in a substance called oxalate. These include: Spinach (cooked), rhubarb, beets, sweet potatoes, and Swiss chard. Peanuts. Potato chips, french fries, and baked potatoes with skin on. Nuts and nut products. Chocolate. If you regularly take a diuretic medicine, make sure to eat at least 1 or 2 servings of fruits or vegetables that are high in potassium each day. These include: Avocado. Banana. Orange, prune,   carrot, or tomato juice. Baked potato. Cabbage. Beans and split peas. Lifestyle  Drink enough fluid to keep your urine pale yellow. This is the most important thing you can do. Spread your fluid intake throughout the day. If you drink alcohol: Limit how much you use to: 0-1 drink a day for women who are not pregnant. 0-2 drinks a day for men. Be aware of how much alcohol is in your  drink. In the [place] one drink equals one 12 oz bottle of beer (355 mL), one 5 oz glass of wine (148 mL), or one 1 oz glass of hard liquor (44 mL). Lose weight if told by your health care provider. Work with your dietitian to find an eating plan and weight loss strategies that work best for you. General information Talk to your health care provider and dietitian about taking daily supplements. You may be told the following depending on your health and the cause of your kidney stones: Not to take supplements with vitamin C. To take a calcium supplement. To take a daily probiotic supplement. To take other supplements such as magnesium, fish oil, or vitamin B6. Take over-the-counter and prescription medicines only as told by your health care provider. These include supplements. What foods should I limit? Limit your intake of the following foods, or eat them as told by your dietitian. Vegetables Spinach. Rhubarb. Beets. Canned vegetables. Pickles. Olives. Baked potatoes with skin. Grains Wheat bran. Baked goods. Salted crackers. Cereals high in sugar. Meats and other proteins Nuts. Nut butters. Large portions of meat, poultry, or fish. Salted, precooked, or cured meats, such as sausages, meat loaves, and hot dogs. Dairy Cheese. Beverages Regular soft drinks. Regular vegetable juice. Seasonings and condiments Seasoning blends with salt. Salad dressings. Soy sauce. Ketchup. Barbecue sauce. Other foods Canned soups. Canned pasta sauce. Casseroles. Pizza. Lasagna. Frozen meals. Potato chips. French fries. The items listed above may not be a complete list of foods and beverages you should limit. Contact a dietitian for more information. What foods should I avoid? Talk to your dietitian about specific foods you should avoid based on the type of kidney stones you have and your overall health. Fruits Grapefruit. The item listed above may not be a complete list of foods and beverages you should  avoid. Contact a dietitian for more information. Summary Kidney stones are deposits of minerals and salts that form inside your kidneys. You can lower your risk of kidney stones by making changes to your diet. The most important thing you can do is drink enough fluid. Drink enough fluid to keep your urine pale yellow. Talk to your dietitian about how much calcium you should have each day, and eat less salt and animal protein as told by your dietitian. This information is not intended to replace advice given to you by your health care provider. Make sure you discuss any questions you have with your health care provider. Document Revised: 08/19/2019 Document Reviewed: 08/19/2019 Elsevier Patient Education  2022 [employer]  

## 2021-06-14 DIAGNOSIS — D225 Melanocytic nevi of trunk: Secondary | ICD-10-CM | POA: Diagnosis not present

## 2021-06-14 DIAGNOSIS — D485 Neoplasm of uncertain behavior of skin: Secondary | ICD-10-CM | POA: Diagnosis not present

## 2021-06-14 DIAGNOSIS — Z1283 Encounter for screening for malignant neoplasm of skin: Secondary | ICD-10-CM | POA: Diagnosis not present

## 2021-06-29 ENCOUNTER — Other Ambulatory Visit: Payer: Self-pay | Admitting: Urology

## 2021-07-16 ENCOUNTER — Other Ambulatory Visit: Payer: Self-pay | Admitting: Urology

## 2021-07-20 ENCOUNTER — Ambulatory Visit (HOSPITAL_COMMUNITY)
Admission: RE | Admit: 2021-07-20 | Discharge: 2021-07-20 | Disposition: A | Discharge status: Home / Self Care | Payer: Medicare HMO | Source: Ambulatory Visit | Attending: Urology | Admitting: Urology

## 2021-07-20 ENCOUNTER — Other Ambulatory Visit: Payer: Self-pay

## 2021-07-20 DIAGNOSIS — N201 Calculus of ureter: Secondary | ICD-10-CM | POA: Insufficient documentation

## 2021-07-20 DIAGNOSIS — N289 Disorder of kidney and ureter, unspecified: Secondary | ICD-10-CM | POA: Diagnosis not present

## 2021-07-20 DIAGNOSIS — N200 Calculus of kidney: Secondary | ICD-10-CM | POA: Diagnosis not present

## 2021-07-24 ENCOUNTER — Ambulatory Visit (INDEPENDENT_AMBULATORY_CARE_PROVIDER_SITE_OTHER): Payer: Medicare HMO | Admitting: Urology

## 2021-07-24 ENCOUNTER — Other Ambulatory Visit: Payer: Self-pay

## 2021-07-24 ENCOUNTER — Encounter: Payer: Self-pay | Admitting: Urology

## 2021-07-24 DIAGNOSIS — N201 Calculus of ureter: Secondary | ICD-10-CM

## 2021-07-24 NOTE — Progress Notes (Signed)
07/24/2021 1:37 PM   William Washington [birth year] [id number]  Referring provider: Asencion Noble, MD [address] [place],  [place] [address]  Patient location: home Physician location: office I connected with  Zigmond Trela on 07/24/21 by a video enabled telemedicine application and verified that I am speaking with the correct person using two identifiers.   I discussed the limitations of evaluation and management by telemedicine. The patient expressed understanding and agreed to proceed.    Followup nephrolithiasis   HPI: William Washington is a 75yo here for followup for nephrolithiasis. Renal US 07/20/2021 shows no hydronephrosis. He does have a 1cm right lower pole calculus. He denies any flank pain. He denies any significant LUTS.  No other complaints today  PMH: Past Medical History:  Diagnosis Date   Diabetes mellitus without complication ([place])    Hypercholesteremia    Hypertension    Sleep apnea     Surgical History: Past Surgical History:  Procedure Laterality Date   COLONOSCOPY WITH PROPOFOL N/A 07/07/2020   Surgeon: Eloise Harman, DO; nonbleeding internal hemorrhoids, diverticulosis in the sigmoid and descending colon, Four 3-5 mm tubular adenomas.  Repeat in 5 years (2026).   CYSTOSCOPY WITH RETROGRADE PYELOGRAM, URETEROSCOPY AND STENT PLACEMENT Right 06/07/2021   Procedure: CYSTOSCOPY WITH RETROGRADE PYELOGRAM, URETEROSCOPY AND STENT PLACEMENT;  Surgeon: Cleon Gustin, MD;  Location: [facility];  Service: Urology;  Laterality: Right;   EXTRACORPOREAL SHOCK WAVE LITHOTRIPSY Right 05/08/2021   Procedure: EXTRACORPOREAL SHOCK WAVE LITHOTRIPSY (ESWL);  Surgeon: Cleon Gustin, MD;  Location: [facility];  Service: Urology;  Laterality: Right;   HOLMIUM LASER APPLICATION Right 17/40/8144   Procedure: HOLMIUM LASER APPLICATION;  Surgeon: Cleon Gustin, MD;  Location: [facility];  Service: Urology;  Laterality: Right;   MENISCECTOMY Left    ORIF FEMUR FRACTURE  Left    POLYPECTOMY  07/07/2020   Procedure: POLYPECTOMY;  Surgeon: Eloise Harman, DO;  [facility];  Service: Endoscopy;;    Home Medications:  Allergies as of 07/24/2021   No Known Allergies      Medication List        Accurate as of July 24, 2021  1:37 PM. If you have any questions, ask your nurse or doctor.          aspirin EC 81 MG tablet Take 81 mg by mouth daily. Swallow whole.   atorvastatin 40 MG tablet Commonly known as: LIPITOR Take 40 mg by mouth at bedtime.   diclofenac Sodium 1 % Gel Commonly known as: VOLTAREN Apply 2 g topically daily.   EQL Vitamin D3 50 MCG (2000 UT) Caps Generic drug: Cholecalciferol Take 2,000 Units by mouth daily.   Fish Oil 1000 MG Caps Take 2,000 mg by mouth daily.   hydroxypropyl methylcellulose / hypromellose 2.5 % ophthalmic solution Commonly known as: ISOPTO TEARS / GONIOVISC Place 1 drop into both eyes 3 (three) times daily as needed for dry eyes.   loratadine-pseudoephedrine 10-240 MG 24 hr tablet Commonly known as: CLARITIN-D 24-hour Take 1 tablet by mouth every other day.   omeprazole 20 MG capsule Commonly known as: PRILOSEC Take 1 capsule (20 mg total) by mouth 2 (two) times daily before a meal.   ondansetron 4 MG tablet Commonly known as: Zofran Take 1 tablet (4 mg total) by mouth daily as needed for nausea or vomiting.   oxybutynin 5 MG tablet Commonly known as: DITROPAN Take 1 tablet (5 mg total) by mouth every 8 (eight) hours as needed  for up to 15 doses (Urinary frequency/urgency).   oxyCODONE-acetaminophen 5-325 MG tablet Commonly known as: Percocet Take 1 tablet by mouth every 4 (four) hours as needed for severe pain.   pioglitazone 30 MG tablet Commonly known as: ACTOS Take 30 mg by mouth daily.   ramipril 5 MG capsule Commonly known as: ALTACE Take 5 mg by mouth daily.   sodium chloride 0.65 % Soln nasal spray Commonly known as: OCEAN Place 1 spray into both nostrils  as needed for congestion.   tamsulosin 0.4 MG Caps capsule Commonly known as: FLOMAX TAKE 1 CAPSULE BY MOUTH EVERY DAY AFTER SUPPER   Tresiba FlexTouch 100 UNIT/ML FlexTouch Pen Generic drug: insulin degludec Inject 25 Units into the skin at bedtime.   Turmeric 500 MG Caps Take 1,000 mg by mouth daily.   vitamin B-12 1000 MCG tablet Commonly known as: CYANOCOBALAMIN Take 1,000 mcg by mouth daily.        Allergies: No Known Allergies  Family History: Family History  Problem Relation Age of Onset   Severe sprains Neg Hx    Scoliosis Neg Hx     Social History:  reports that he has never smoked. He has never used smokeless tobacco. He reports current alcohol use. He reports that he does not use drugs.  ROS: All other review of systems were reviewed and are negative except what is noted above in HPI   Laboratory Data: Lab Results  Component Value Date   WBC 6.4 05/16/2021   HGB 9.4 (L) 05/16/2021   HCT 30.5 (L) 05/16/2021   MCV 64.6 (L) 05/16/2021   PLT 182 05/16/2021    Lab Results  Component Value Date   CREATININE 1.07 05/16/2021    No results found for: PSA  No results found for: TESTOSTERONE  Lab Results  Component Value Date   HGBA1C 6.5 (H) 06/05/2021    Urinalysis    Component Value Date/Time   APPEARANCEUR Cloudy (A) 06/13/2021 1453   GLUCOSEU Negative 06/13/2021 1453   BILIRUBINUR Negative 06/13/2021 1453   PROTEINUR 1+ (A) 06/13/2021 1453   NITRITE Negative 06/13/2021 1453   LEUKOCYTESUR Trace (A) 06/13/2021 1453    Lab Results  Component Value Date   LABMICR See below: 06/13/2021   WBCUA 0-5 06/13/2021   LABEPIT 0-10 06/13/2021   MUCUS Present 02/06/2021   BACTERIA Few (A) 06/13/2021    Pertinent Imaging: Renal US 07/20/2021: Images reviewed and discussed with the patient Results for orders placed during the hospital encounter of 05/28/21  DG Abd 1 View  Narrative CLINICAL DATA:  Kidney stone.  EXAM: ABDOMEN - 1  VIEW  COMPARISON:  Abdominal x-ray 05/08/2021. CT abdomen and pelvis 04/10/2021.  FINDINGS: The bowel gas pattern is normal. Again seen are 2 radiopaque calculi in the left kidney measuring up to 5 mm. These are unchanged in size and position. There is a stable ovoid 11 mm calcification just right of L5, unchanged in size and position. No new suspicious calculi are seen.  IMPRESSION: 1. Unchanged left renal calculi. 2. Unchanged ovoid calcification to the right of L5 measuring 11 mm.   Electronically Signed By: Ronney Asters M.D. On: 05/28/2021 22:49  No results found for this or any previous visit.  No results found for this or any previous visit.  No results found for this or any previous visit.  Results for orders placed during the hospital encounter of 07/20/21  Ultrasound renal complete  Narrative CLINICAL DATA:  Nephrolithiasis.  EXAM: RENAL / URINARY  TRACT ULTRASOUND COMPLETE  COMPARISON:  CT [facility], 04/10/2021.  FINDINGS: Right Kidney:  Renal measurements: 12.0 x 5.9 x 6.2 cm = volume: 232 mL. Echogenicity within normal limits. Diffuse renal cortical thinning. No hydronephrosis.  Small, rounded anechoic lesion with thin wall at the mid RIGHT kidney, consistent with simple cyst and measuring approximately 1.6 x 1.2 x 1.9 cm  RIGHT inferior renal collecting system rounded echogenic lesion with "twinkle" artifact consistent with a nephrolith measuring up to 1.0 cm.  Left Kidney:  Renal measurements: 9.2 x 4.2 x 4.4 cm = volume: 88.6 mL. Echogenicity within normal limits. Diffuse renal cortical thinning. No hydronephrosis.  Dystrophic appearance of the superior LEFT renal pole, in an area measuring approximately 5.6 x 4.3 x 5.6 cm, attributed to scarring on recent comparison CT.  Bladder:  Appears normal for degree of bladder distention.  Other:  No intra-abdominal ascites  IMPRESSION: 1. 1.0 cm nonobstructing RIGHT inferior renal collecting  system nephrolith. No hydronephrosis. 2. Bilateral senescent renal change, with diffuse renal cortical thinning. 3. LEFT superior renal pole dystrophic appearance may represents scarring. Attention on follow-up.   Electronically Signed By: Michaelle Birks M.D. On: 07/23/2021 09:16  No results found for this or any previous visit.  No results found for this or any previous visit.  No results found for this or any previous visit.   Assessment & Plan:    1. Nephrolithiasis -Dietary handout given -RTC 6 months with a renal US   No follow-ups on file.  [name], MD  [facility] [place]

## 2021-07-24 NOTE — Patient Instructions (Signed)
Dietary Guidelines to Help Prevent Kidney Stones Kidney stones are deposits of minerals and salts that form inside your kidneys. Your risk of developing kidney stones may be greater depending on your diet, your lifestyle, the medicines you take, and whether you have certain medical conditions. Most people can lower their chances of developing kidney stones by following the instructions below. Your dietitian may give you more specific instructions depending on your overall health and the type of kidney stones you tend to develop. What are tips for following this plan? Reading food labels  Choose foods with "no salt added" or "low-salt" labels. Limit your salt (sodium) intake to less than 1,500 mg a day. Choose foods with calcium for each meal and snack. Try to eat about 300 mg of calcium at each meal. Foods that contain 200-500 mg of calcium a serving include: 8 oz (237 mL) of milk, calcium-fortifiednon-dairy milk, and calcium-fortifiedfruit juice. Calcium-fortified means that calcium has been added to these drinks. 8 oz (237 mL) of kefir, yogurt, and soy yogurt. 4 oz (114 g) of tofu. 1 oz (28 g) of cheese. 1 cup (150 g) of dried figs. 1 cup (91 g) of cooked broccoli. One 3 oz (85 g) can of sardines or mackerel. Most people need 1,000-1,500 mg of calcium a day. Talk to your dietitian about how much calcium is recommended for you. Shopping Buy plenty of fresh fruits and vegetables. Most people do not need to avoid fruits and vegetables, even if these foods contain nutrients that may contribute to kidney stones. When shopping for convenience foods, choose: Whole pieces of fruit. Pre-made salads with dressing on the side. Low-fat fruit and yogurt smoothies. Avoid buying frozen meals or prepared deli foods. These can be high in sodium. Look for foods with live cultures, such as yogurt and kefir. Choose high-fiber grains, such as whole-wheat breads, oat bran, and wheat cereals. Cooking Do not add  salt to food when cooking. Place a salt shaker on the table and allow each person to add his or her own salt to taste. Use vegetable protein, such as beans, textured vegetable protein (TVP), or tofu, instead of meat in pasta, casseroles, and soups. Meal planning Eat less salt, if told by your dietitian. To do this: Avoid eating processed or pre-made food. Avoid eating fast food. Eat less animal protein, including cheese, meat, poultry, or fish, if told by your dietitian. To do this: Limit the number of times you have meat, poultry, fish, or cheese each week. Eat a diet free of meat at least 2 days a week. Eat only one serving each day of meat, poultry, fish, or seafood. When you prepare animal protein, cut pieces into small portion sizes. For most meat and fish, one serving is about the size of the palm of your hand. Eat at least five servings of fresh fruits and vegetables each day. To do this: Keep fruits and vegetables on hand for snacks. Eat one piece of fruit or a handful of berries with breakfast. Have a salad and fruit at lunch. Have two kinds of vegetables at dinner. Limit foods that are high in a substance called oxalate. These include: Spinach (cooked), rhubarb, beets, sweet potatoes, and Swiss chard. Peanuts. Potato chips, french fries, and baked potatoes with skin on. Nuts and nut products. Chocolate. If you regularly take a diuretic medicine, make sure to eat at least 1 or 2 servings of fruits or vegetables that are high in potassium each day. These include: Avocado. Banana. Orange, prune,   carrot, or tomato juice. Baked potato. Cabbage. Beans and split peas. Lifestyle  Drink enough fluid to keep your urine pale yellow. This is the most important thing you can do. Spread your fluid intake throughout the day. If you drink alcohol: Limit how much you use to: 0-1 drink a day for women who are not pregnant. 0-2 drinks a day for men. Be aware of how much alcohol is in your  drink. In the [place] one drink equals one 12 oz bottle of beer (355 mL), one 5 oz glass of wine (148 mL), or one 1 oz glass of hard liquor (44 mL). Lose weight if told by your health care provider. Work with your dietitian to find an eating plan and weight loss strategies that work best for you. General information Talk to your health care provider and dietitian about taking daily supplements. You may be told the following depending on your health and the cause of your kidney stones: Not to take supplements with vitamin C. To take a calcium supplement. To take a daily probiotic supplement. To take other supplements such as magnesium, fish oil, or vitamin B6. Take over-the-counter and prescription medicines only as told by your health care provider. These include supplements. What foods should I limit? Limit your intake of the following foods, or eat them as told by your dietitian. Vegetables Spinach. Rhubarb. Beets. Canned vegetables. Pickles. Olives. Baked potatoes with skin. Grains Wheat bran. Baked goods. Salted crackers. Cereals high in sugar. Meats and other proteins Nuts. Nut butters. Large portions of meat, poultry, or fish. Salted, precooked, or cured meats, such as sausages, meat loaves, and hot dogs. Dairy Cheese. Beverages Regular soft drinks. Regular vegetable juice. Seasonings and condiments Seasoning blends with salt. Salad dressings. Soy sauce. Ketchup. Barbecue sauce. Other foods Canned soups. Canned pasta sauce. Casseroles. Pizza. Lasagna. Frozen meals. Potato chips. French fries. The items listed above may not be a complete list of foods and beverages you should limit. Contact a dietitian for more information. What foods should I avoid? Talk to your dietitian about specific foods you should avoid based on the type of kidney stones you have and your overall health. Fruits Grapefruit. The item listed above may not be a complete list of foods and beverages you should  avoid. Contact a dietitian for more information. Summary Kidney stones are deposits of minerals and salts that form inside your kidneys. You can lower your risk of kidney stones by making changes to your diet. The most important thing you can do is drink enough fluid. Drink enough fluid to keep your urine pale yellow. Talk to your dietitian about how much calcium you should have each day, and eat less salt and animal protein as told by your dietitian. This information is not intended to replace advice given to you by your health care provider. Make sure you discuss any questions you have with your health care provider. Document Revised: 08/19/2019 Document Reviewed: 08/19/2019 Elsevier Patient Education  2022 [employer]  

## 2021-08-15 DIAGNOSIS — E1129 Type 2 diabetes mellitus with other diabetic kidney complication: Secondary | ICD-10-CM | POA: Diagnosis not present

## 2021-08-21 DIAGNOSIS — I10 Essential (primary) hypertension: Secondary | ICD-10-CM | POA: Diagnosis not present

## 2021-08-21 DIAGNOSIS — R7309 Other abnormal glucose: Secondary | ICD-10-CM | POA: Diagnosis not present

## 2021-08-21 DIAGNOSIS — E1122 Type 2 diabetes mellitus with diabetic chronic kidney disease: Secondary | ICD-10-CM | POA: Diagnosis not present

## 2021-11-14 DIAGNOSIS — I10 Essential (primary) hypertension: Secondary | ICD-10-CM | POA: Diagnosis not present

## 2021-11-14 DIAGNOSIS — D550 Anemia due to glucose-6-phosphate dehydrogenase [G6PD] deficiency: Secondary | ICD-10-CM | POA: Diagnosis not present

## 2021-11-14 DIAGNOSIS — Z79899 Other long term (current) drug therapy: Secondary | ICD-10-CM | POA: Diagnosis not present

## 2021-11-14 DIAGNOSIS — E1129 Type 2 diabetes mellitus with other diabetic kidney complication: Secondary | ICD-10-CM | POA: Diagnosis not present

## 2021-11-20 DIAGNOSIS — R7309 Other abnormal glucose: Secondary | ICD-10-CM | POA: Diagnosis not present

## 2021-11-20 DIAGNOSIS — E1122 Type 2 diabetes mellitus with diabetic chronic kidney disease: Secondary | ICD-10-CM | POA: Diagnosis not present

## 2021-11-20 DIAGNOSIS — I10 Essential (primary) hypertension: Secondary | ICD-10-CM | POA: Diagnosis not present

## 2021-11-21 ENCOUNTER — Inpatient Hospital Stay (HOSPITAL_COMMUNITY): Payer: Medicare HMO

## 2021-11-28 ENCOUNTER — Ambulatory Visit (HOSPITAL_COMMUNITY): Payer: Medicare HMO | Admitting: Physician Assistant

## 2022-01-15 ENCOUNTER — Inpatient Hospital Stay (HOSPITAL_COMMUNITY): Payer: Medicare HMO

## 2022-01-21 ENCOUNTER — Ambulatory Visit (HOSPITAL_COMMUNITY)
Admission: RE | Admit: 2022-01-21 | Discharge: 2022-01-21 | Disposition: A | Discharge status: Home / Self Care | Payer: Medicare HMO | Source: Ambulatory Visit | Attending: Urology | Admitting: Urology

## 2022-01-21 DIAGNOSIS — N281 Cyst of kidney, acquired: Secondary | ICD-10-CM | POA: Diagnosis not present

## 2022-01-21 DIAGNOSIS — Z87442 Personal history of urinary calculi: Secondary | ICD-10-CM | POA: Diagnosis not present

## 2022-01-21 DIAGNOSIS — N201 Calculus of ureter: Secondary | ICD-10-CM | POA: Diagnosis not present

## 2022-01-22 ENCOUNTER — Ambulatory Visit (HOSPITAL_COMMUNITY): Payer: Medicare HMO | Admitting: Physician Assistant

## 2022-01-23 ENCOUNTER — Ambulatory Visit (HOSPITAL_COMMUNITY)
Admission: RE | Admit: 2022-01-23 | Discharge: 2022-01-23 | Disposition: A | Discharge status: Home / Self Care | Payer: Medicare HMO | Source: Ambulatory Visit | Attending: Urology | Admitting: Urology

## 2022-01-23 ENCOUNTER — Ambulatory Visit: Payer: Medicare HMO | Admitting: Urology

## 2022-01-23 VITALS — BP 129/70 | HR 103

## 2022-01-23 DIAGNOSIS — N201 Calculus of ureter: Secondary | ICD-10-CM | POA: Diagnosis not present

## 2022-01-23 DIAGNOSIS — N200 Calculus of kidney: Secondary | ICD-10-CM | POA: Diagnosis not present

## 2022-01-23 DIAGNOSIS — R1031 Right lower quadrant pain: Secondary | ICD-10-CM | POA: Diagnosis not present

## 2022-01-23 LAB — MICROSCOPIC EXAMINATION
RBC, Urine: NONE SEEN /hpf (ref 0–2)
Renal Epithel, UA: NONE SEEN /hpf

## 2022-01-23 LAB — URINALYSIS, ROUTINE W REFLEX MICROSCOPIC
Bilirubin, UA: NEGATIVE
Glucose, UA: NEGATIVE
Ketones, UA: NEGATIVE
Nitrite, UA: NEGATIVE
RBC, UA: NEGATIVE
Specific Gravity, UA: 1.02 (ref 1.005–1.030)
Urobilinogen, Ur: 0.2 mg/dL (ref 0.2–1.0)
pH, UA: 5.5 (ref 5.0–7.5)

## 2022-01-23 NOTE — Progress Notes (Signed)
01/23/2022 3:02 PM   William Washington 1946-03-15 [id number]  Referring provider: Asencion Noble, MD [address] [place],  [place] [address]  Followup nephrolithiasis   HPI: William Washington is a 76yo here for followup for nephrolithiasis. Renal US yesterday shows no calculi. He has intermittent groin pain. Renal US from yesterday shows no stone. He has intermittent left groin pain that is new for the past 2 months. This is the same feeling he had when he had the first stone event   PMH: Past Medical History:  Diagnosis Date   Diabetes mellitus without complication ([place])    Hypercholesteremia    Hypertension    Sleep apnea     Surgical History: Past Surgical History:  Procedure Laterality Date   COLONOSCOPY WITH PROPOFOL N/A 07/07/2020   Surgeon: Eloise Harman, DO; nonbleeding internal hemorrhoids, diverticulosis in the sigmoid and descending colon, Four 3-5 mm tubular adenomas.  Repeat in 5 years (2026).   CYSTOSCOPY WITH RETROGRADE PYELOGRAM, URETEROSCOPY AND STENT PLACEMENT Right 06/07/2021   Procedure: CYSTOSCOPY WITH RETROGRADE PYELOGRAM, URETEROSCOPY AND STENT PLACEMENT;  Surgeon: Cleon Gustin, MD;  Location: [facility];  Service: Urology;  Laterality: Right;   EXTRACORPOREAL SHOCK WAVE LITHOTRIPSY Right 05/08/2021   Procedure: EXTRACORPOREAL SHOCK WAVE LITHOTRIPSY (ESWL);  Surgeon: Cleon Gustin, MD;  Location: [facility];  Service: Urology;  Laterality: Right;   HOLMIUM LASER APPLICATION Right 42/70/6237   Procedure: HOLMIUM LASER APPLICATION;  Surgeon: Cleon Gustin, MD;  Location: [facility];  Service: Urology;  Laterality: Right;   MENISCECTOMY Left    ORIF FEMUR FRACTURE Left    POLYPECTOMY  07/07/2020   Procedure: POLYPECTOMY;  Surgeon: Eloise Harman, DO;  [facility];  Service: Endoscopy;;    Home Medications:  Allergies as of 01/23/2022   No Known Allergies      Medication List        Accurate as of Jan 23, 2022  3:02 PM. If  you have any questions, ask your nurse or doctor.          aspirin EC 81 MG tablet Take 81 mg by mouth daily. Swallow whole.   atorvastatin 40 MG tablet Commonly known as: LIPITOR Take 40 mg by mouth at bedtime.   diclofenac Sodium 1 % Gel Commonly known as: VOLTAREN Apply 2 g topically daily.   EQL Vitamin D3 50 MCG (2000 UT) Caps Generic drug: Cholecalciferol Take 2,000 Units by mouth daily.   Fish Oil 1000 MG Caps Take 2,000 mg by mouth daily.   hydroxypropyl methylcellulose / hypromellose 2.5 % ophthalmic solution Commonly known as: ISOPTO TEARS / GONIOVISC Place 1 drop into both eyes 3 (three) times daily as needed for dry eyes.   loratadine-pseudoephedrine 10-240 MG 24 hr tablet Commonly known as: CLARITIN-D 24-hour Take 1 tablet by mouth every other day.   omeprazole 20 MG capsule Commonly known as: PRILOSEC Take 1 capsule (20 mg total) by mouth 2 (two) times daily before a meal.   ondansetron 4 MG tablet Commonly known as: Zofran Take 1 tablet (4 mg total) by mouth daily as needed for nausea or vomiting.   oxybutynin 5 MG tablet Commonly known as: DITROPAN Take 1 tablet (5 mg total) by mouth every 8 (eight) hours as needed for up to 15 doses (Urinary frequency/urgency).   oxyCODONE-acetaminophen 5-325 MG tablet Commonly known as: Percocet Take 1 tablet by mouth every 4 (four) hours as needed for severe pain.   pioglitazone 30 MG tablet Commonly known  as: ACTOS Take 30 mg by mouth daily.   ramipril 5 MG capsule Commonly known as: ALTACE Take 5 mg by mouth daily.   sodium chloride 0.65 % Soln nasal spray Commonly known as: OCEAN Place 1 spray into both nostrils as needed for congestion.   tamsulosin 0.4 MG Caps capsule Commonly known as: FLOMAX TAKE 1 CAPSULE BY MOUTH EVERY DAY AFTER SUPPER   Tresiba FlexTouch 100 UNIT/ML FlexTouch Pen Generic drug: insulin degludec Inject 25 Units into the skin at bedtime.   Turmeric 500 MG Caps Take 1,000  mg by mouth daily.   vitamin B-12 1000 MCG tablet Commonly known as: CYANOCOBALAMIN Take 1,000 mcg by mouth daily.        Allergies: No Known Allergies  Family History: Family History  Problem Relation Age of Onset   Severe sprains Neg Hx    Scoliosis Neg Hx     Social History:  reports that he has never smoked. He has never used smokeless tobacco. He reports current alcohol use. He reports that he does not use drugs.  ROS: All other review of systems were reviewed and are negative except what is noted above in HPI  Physical Exam: BP 129/70   Pulse (!) 103   Constitutional:  Alert and oriented, No acute distress. HEENT: [place] AT, moist mucus membranes.  Trachea midline, no masses. Cardiovascular: No clubbing, cyanosis, or edema. Respiratory: Normal respiratory effort, no increased work of breathing. GI: Abdomen is soft, nontender, nondistended, no abdominal masses GU: No CVA tenderness.  Lymph: No cervical or inguinal lymphadenopathy. Skin: No rashes, bruises or suspicious lesions. Neurologic: Grossly intact, no focal deficits, moving all 4 extremities. Psychiatric: Normal mood and affect.  Laboratory Data: Lab Results  Component Value Date   WBC 6.4 05/16/2021   HGB 9.4 (L) 05/16/2021   HCT 30.5 (L) 05/16/2021   MCV 64.6 (L) 05/16/2021   PLT 182 05/16/2021    Lab Results  Component Value Date   CREATININE 1.07 05/16/2021    No results found for: PSA  No results found for: TESTOSTERONE  Lab Results  Component Value Date   HGBA1C 6.5 (H) 06/05/2021    Urinalysis    Component Value Date/Time   APPEARANCEUR Cloudy (A) 06/13/2021 1453   GLUCOSEU Negative 06/13/2021 1453   BILIRUBINUR Negative 06/13/2021 1453   PROTEINUR 1+ (A) 06/13/2021 1453   NITRITE Negative 06/13/2021 1453   LEUKOCYTESUR Trace (A) 06/13/2021 1453    Lab Results  Component Value Date   LABMICR See below: 06/13/2021   WBCUA 0-5 06/13/2021   LABEPIT 0-10 06/13/2021   MUCUS  Present 02/06/2021   BACTERIA Few (A) 06/13/2021    Pertinent Imaging: Renal US 01/21/2022: Images reviewed and discussed with the patient  Results for orders placed during the hospital encounter of 05/28/21  DG Abd 1 View  Narrative CLINICAL DATA:  Kidney stone.  EXAM: ABDOMEN - 1 VIEW  COMPARISON:  Abdominal x-ray 05/08/2021. CT abdomen and pelvis 04/10/2021.  FINDINGS: The bowel gas pattern is normal. Again seen are 2 radiopaque calculi in the left kidney measuring up to 5 mm. These are unchanged in size and position. There is a stable ovoid 11 mm calcification just right of L5, unchanged in size and position. No new suspicious calculi are seen.  IMPRESSION: 1. Unchanged left renal calculi. 2. Unchanged ovoid calcification to the right of L5 measuring 11 mm.   Electronically Signed By: Ronney Asters M.D. On: 05/28/2021 22:49  No results found for this or  any previous visit.  No results found for this or any previous visit.  No results found for this or any previous visit.  Results for orders placed during the hospital encounter of 01/21/22  Ultrasound renal complete  Narrative CLINICAL DATA:  History of nephrolithiasis.  EXAM: RENAL / URINARY TRACT ULTRASOUND COMPLETE  COMPARISON:  Renal ultrasound July 20, 2021  FINDINGS: Right Kidney:  Renal measurements: 11.6 x 6.6 x 5.9 cm = volume: 236 mL. Contains a 1.9 x 1.4 x 2.4 cm simple cyst of no clinical significance. No follow-up imaging recommended.  Left Kidney:  Renal measurements: 8.5 x 4.6 x 4.6 cm = volume: 93.1 mL. Echogenicity within normal limits. No mass or hydronephrosis visualized.  Bladder:  Appears normal for degree of bladder distention.  Other:  None.  IMPRESSION: No significant abnormalities identified. No renal stones or obstruction.   Electronically Signed By: Dorise Bullion III M.D. On: 01/22/2022 17:17  No results found for this or any previous visit.  No  results found for this or any previous visit.  No results found for this or any previous visit.   Assessment & Plan:    1. Calculus of ureter KUB today, will call with results - Urinalysis, Routine w reflex microscopic   No follow-ups on file.  [name], MD  [facility] [place]

## 2022-01-28 ENCOUNTER — Encounter: Payer: Self-pay | Admitting: Urology

## 2022-01-28 NOTE — Patient Instructions (Signed)
Dietary Guidelines to Help Prevent Kidney Stones Kidney stones are deposits of minerals and salts that form inside your kidneys. Your risk of developing kidney stones may be greater depending on your diet, your lifestyle, the medicines you take, and whether you have certain medical conditions. Most people can lower their chances of developing kidney stones by following the instructions below. Your dietitian may give you more specific instructions depending on your overall health and the type of kidney stones you tend to develop. What are tips for following this plan? Reading food labels  Choose foods with "no salt added" or "low-salt" labels. Limit your salt (sodium) intake to less than 1,500 mg a day. Choose foods with calcium for each meal and snack. Try to eat about 300 mg of calcium at each meal. Foods that contain 200-500 mg of calcium a serving include: 8 oz (237 mL) of milk, calcium-fortifiednon-dairy milk, and calcium-fortifiedfruit juice. Calcium-fortified means that calcium has been added to these drinks. 8 oz (237 mL) of kefir, yogurt, and soy yogurt. 4 oz (114 g) of tofu. 1 oz (28 g) of cheese. 1 cup (150 g) of dried figs. 1 cup (91 g) of cooked broccoli. One 3 oz (85 g) can of sardines or mackerel. Most people need 1,000-1,500 mg of calcium a day. Talk to your dietitian about how much calcium is recommended for you. Shopping Buy plenty of fresh fruits and vegetables. Most people do not need to avoid fruits and vegetables, even if these foods contain nutrients that may contribute to kidney stones. When shopping for convenience foods, choose: Whole pieces of fruit. Pre-made salads with dressing on the side. Low-fat fruit and yogurt smoothies. Avoid buying frozen meals or prepared deli foods. These can be high in sodium. Look for foods with live cultures, such as yogurt and kefir. Choose high-fiber grains, such as whole-wheat breads, oat bran, and wheat cereals. Cooking Do not add  salt to food when cooking. Place a salt shaker on the table and allow each person to add his or her own salt to taste. Use vegetable protein, such as beans, textured vegetable protein (TVP), or tofu, instead of meat in pasta, casseroles, and soups. Meal planning Eat less salt, if told by your dietitian. To do this: Avoid eating processed or pre-made food. Avoid eating fast food. Eat less animal protein, including cheese, meat, poultry, or fish, if told by your dietitian. To do this: Limit the number of times you have meat, poultry, fish, or cheese each week. Eat a diet free of meat at least 2 days a week. Eat only one serving each day of meat, poultry, fish, or seafood. When you prepare animal protein, cut pieces into small portion sizes. For most meat and fish, one serving is about the size of the palm of your hand. Eat at least five servings of fresh fruits and vegetables each day. To do this: Keep fruits and vegetables on hand for snacks. Eat one piece of fruit or a handful of berries with breakfast. Have a salad and fruit at lunch. Have two kinds of vegetables at dinner. Limit foods that are high in a substance called oxalate. These include: Spinach (cooked), rhubarb, beets, sweet potatoes, and Swiss chard. Peanuts. Potato chips, french fries, and baked potatoes with skin on. Nuts and nut products. Chocolate. If you regularly take a diuretic medicine, make sure to eat at least 1 or 2 servings of fruits or vegetables that are high in potassium each day. These include: Avocado. Banana. Orange, prune,   carrot, or tomato juice. Baked potato. Cabbage. Beans and split peas. Lifestyle  Drink enough fluid to keep your urine pale yellow. This is the most important thing you can do. Spread your fluid intake throughout the day. If you drink alcohol: Limit how much you use to: 0-1 drink a day for women who are not pregnant. 0-2 drinks a day for men. Be aware of how much alcohol is in your  drink. In the [place] one drink equals one 12 oz bottle of beer (355 mL), one 5 oz glass of wine (148 mL), or one 1 oz glass of hard liquor (44 mL). Lose weight if told by your health care provider. Work with your dietitian to find an eating plan and weight loss strategies that work best for you. General information Talk to your health care provider and dietitian about taking daily supplements. You may be told the following depending on your health and the cause of your kidney stones: Not to take supplements with vitamin C. To take a calcium supplement. To take a daily probiotic supplement. To take other supplements such as magnesium, fish oil, or vitamin B6. Take over-the-counter and prescription medicines only as told by your health care provider. These include supplements. What foods should I limit? Limit your intake of the following foods, or eat them as told by your dietitian. Vegetables Spinach. Rhubarb. Beets. Canned vegetables. Pickles. Olives. Baked potatoes with skin. Grains Wheat bran. Baked goods. Salted crackers. Cereals high in sugar. Meats and other proteins Nuts. Nut butters. Large portions of meat, poultry, or fish. Salted, precooked, or cured meats, such as sausages, meat loaves, and hot dogs. Dairy Cheese. Beverages Regular soft drinks. Regular vegetable juice. Seasonings and condiments Seasoning blends with salt. Salad dressings. Soy sauce. Ketchup. Barbecue sauce. Other foods Canned soups. Canned pasta sauce. Casseroles. Pizza. Lasagna. Frozen meals. Potato chips. French fries. The items listed above may not be a complete list of foods and beverages you should limit. Contact a dietitian for more information. What foods should I avoid? Talk to your dietitian about specific foods you should avoid based on the type of kidney stones you have and your overall health. Fruits Grapefruit. The item listed above may not be a complete list of foods and beverages you should  avoid. Contact a dietitian for more information. Summary Kidney stones are deposits of minerals and salts that form inside your kidneys. You can lower your risk of kidney stones by making changes to your diet. The most important thing you can do is drink enough fluid. Drink enough fluid to keep your urine pale yellow. Talk to your dietitian about how much calcium you should have each day, and eat less salt and animal protein as told by your dietitian. This information is not intended to replace advice given to you by your health care provider. Make sure you discuss any questions you have with your health care provider. Document Revised: 05/07/2021 Document Reviewed: 05/07/2021 Elsevier Patient Education  2023 [employer]  

## 2022-02-01 DIAGNOSIS — H52223 Regular astigmatism, bilateral: Secondary | ICD-10-CM | POA: Diagnosis not present

## 2022-02-01 DIAGNOSIS — E119 Type 2 diabetes mellitus without complications: Secondary | ICD-10-CM | POA: Diagnosis not present

## 2022-02-01 DIAGNOSIS — Z0100 Encounter for examination of eyes and vision without abnormal findings: Secondary | ICD-10-CM | POA: Diagnosis not present

## 2022-02-01 DIAGNOSIS — H25013 Cortical age-related cataract, bilateral: Secondary | ICD-10-CM | POA: Diagnosis not present

## 2022-02-01 DIAGNOSIS — H524 Presbyopia: Secondary | ICD-10-CM | POA: Diagnosis not present

## 2022-02-01 DIAGNOSIS — H5203 Hypermetropia, bilateral: Secondary | ICD-10-CM | POA: Diagnosis not present

## 2022-02-14 ENCOUNTER — Telehealth: Payer: Self-pay

## 2022-02-14 NOTE — Telephone Encounter (Signed)
Patient called and wanted to know is someone could call him regarding imaging results.     Thank you

## 2022-02-18 NOTE — Telephone Encounter (Signed)
Recent imaging on 05/17 asking for results.

## 2022-02-19 NOTE — Telephone Encounter (Signed)
Patient called and notified of results.  Voiced understanding. 

## 2022-02-27 DIAGNOSIS — E1129 Type 2 diabetes mellitus with other diabetic kidney complication: Secondary | ICD-10-CM | POA: Diagnosis not present

## 2022-03-05 DIAGNOSIS — I10 Essential (primary) hypertension: Secondary | ICD-10-CM | POA: Diagnosis not present

## 2022-03-05 DIAGNOSIS — E1122 Type 2 diabetes mellitus with diabetic chronic kidney disease: Secondary | ICD-10-CM | POA: Diagnosis not present

## 2022-03-05 DIAGNOSIS — R739 Hyperglycemia, unspecified: Secondary | ICD-10-CM | POA: Diagnosis not present

## 2022-04-18 ENCOUNTER — Encounter: Payer: Self-pay | Admitting: Internal Medicine

## 2022-04-18 ENCOUNTER — Ambulatory Visit: Payer: Medicare HMO | Admitting: Internal Medicine

## 2022-04-18 VITALS — BP 127/69 | HR 98 | Temp 97.3°F | Ht 65.0 in | Wt 211.6 lb

## 2022-04-18 DIAGNOSIS — K580 Irritable bowel syndrome with diarrhea: Secondary | ICD-10-CM | POA: Diagnosis not present

## 2022-04-18 DIAGNOSIS — R1032 Left lower quadrant pain: Secondary | ICD-10-CM

## 2022-04-18 DIAGNOSIS — R152 Fecal urgency: Secondary | ICD-10-CM | POA: Diagnosis not present

## 2022-04-18 MED ORDER — DICYCLOMINE HCL 10 MG PO CAPS: 10.0000 mg | ORAL_CAPSULE | Freq: Three times a day (TID) | ORAL | 5 refills | Status: DC

## 2022-04-18 NOTE — Progress Notes (Signed)
Referring Provider: Asencion Noble, MD Primary Care Physician:  Asencion Noble, MD Primary GI:  Dr. Abbey Chatters  Chief Complaint  Patient presents with   Follow-up    Change in bowel habits. 2 or 3 soft stools a day most time after eating    HPI:   William Washington is a 76 y.o. male who presents to clinic today for follow-up visit.  Underwent colonoscopy in October for screening purposes found to have multiple tubular adenomas with 5-year recall.    Previously seen for acid reflux and halitosis.  Placed on omeprazole which did not help.  Thought to be due to dentition.  Following with dentist.  Today complains of chronic diarrhea.  Will have 3-4 loose bowel movements daily.  Historically he has been on the looser side averaging 2 bowel movements a day but his symptoms have worsened.  Also notes fecal urgency.  Intermittent left lower quadrant and right lower quadrant abdominal pain.  Mild to moderate in nature.  Occasionally wakes him up from sleep.  Notes that soon as he gets done eating he will have to run to the bathroom at times.  Past Medical History:  Diagnosis Date   Diabetes mellitus without complication ([place])    Hypercholesteremia    Hypertension    Sleep apnea     Past Surgical History:  Procedure Laterality Date   COLONOSCOPY WITH PROPOFOL N/A 07/07/2020   Surgeon: Eloise Harman, DO; nonbleeding internal hemorrhoids, diverticulosis in the sigmoid and descending colon, Four 3-5 mm tubular adenomas.  Repeat in 5 years (2026).   CYSTOSCOPY WITH RETROGRADE PYELOGRAM, URETEROSCOPY AND STENT PLACEMENT Right 06/07/2021   Procedure: CYSTOSCOPY WITH RETROGRADE PYELOGRAM, URETEROSCOPY AND STENT PLACEMENT;  Surgeon: Cleon Gustin, MD;  Location: [facility];  Service: Urology;  Laterality: Right;   EXTRACORPOREAL SHOCK WAVE LITHOTRIPSY Right 05/08/2021   Procedure: EXTRACORPOREAL SHOCK WAVE LITHOTRIPSY (ESWL);  Surgeon: Cleon Gustin, MD;  Location: [facility];  Service: Urology;   Laterality: Right;   HOLMIUM LASER APPLICATION Right 56/43/3295   Procedure: HOLMIUM LASER APPLICATION;  Surgeon: Cleon Gustin, MD;  Location: [facility];  Service: Urology;  Laterality: Right;   MENISCECTOMY Left    ORIF FEMUR FRACTURE Left    POLYPECTOMY  07/07/2020   Procedure: POLYPECTOMY;  Surgeon: Eloise Harman, DO;  [facility]: [facility];  Service: Endoscopy;;    Current Outpatient Medications  Medication Sig Dispense Refill   aspirin EC 81 MG tablet Take 81 mg by mouth daily. Swallow whole.     atorvastatin (LIPITOR) 40 MG tablet Take 40 mg by mouth at bedtime.      Cholecalciferol (EQL VITAMIN D3) 50 MCG (2000 UT) CAPS Take 2,000 Units by mouth daily.     diclofenac Sodium (VOLTAREN) 1 % GEL Apply 2 g topically daily.     hydroxypropyl methylcellulose / hypromellose (ISOPTO TEARS / GONIOVISC) 2.5 % ophthalmic solution Place 1 drop into both eyes 3 (three) times daily as needed for dry eyes.     loratadine-pseudoephedrine (CLARITIN-D 24-HOUR) 10-240 MG 24 hr tablet Take 1 tablet by mouth every other day.      Omega-3 Fatty Acids (FISH OIL) 1000 MG CAPS Take 2,000 mg by mouth daily.     ondansetron (ZOFRAN) 4 MG tablet Take 1 tablet (4 mg total) by mouth daily as needed for nausea or vomiting. 30 tablet 1   oxybutynin (DITROPAN) 5 MG tablet Take 1 tablet (5 mg total) by mouth every 8 (eight) hours as needed  for up to 15 doses (Urinary frequency/urgency). 60 tablet 11   pioglitazone (ACTOS) 30 MG tablet Take 30 mg by mouth daily.     ramipril (ALTACE) 5 MG capsule Take 5 mg by mouth daily.     sodium chloride (OCEAN) 0.65 % SOLN nasal spray Place 1 spray into both nostrils as needed for congestion.     TRESIBA FLEXTOUCH 100 UNIT/ML FlexTouch Pen Inject 25 Units into the skin at bedtime.     Turmeric 500 MG CAPS Take 1,000 mg by mouth daily.      vitamin B-12 (CYANOCOBALAMIN) 1000 MCG tablet Take 1,000 mcg by mouth daily.     omeprazole (PRILOSEC) 20 MG capsule Take 1 capsule  (20 mg total) by mouth 2 (two) times daily before a meal. (Patient not taking: Reported on 06/13/2021) 60 capsule 5   oxyCODONE-acetaminophen (PERCOCET) 5-325 MG tablet Take 1 tablet by mouth every 4 (four) hours as needed for severe pain. (Patient not taking: Reported on 04/18/2022) 30 tablet 0   tamsulosin (FLOMAX) 0.4 MG CAPS capsule TAKE 1 CAPSULE BY MOUTH EVERY DAY AFTER SUPPER (Patient not taking: Reported on 04/18/2022) 90 capsule 1   No current facility-administered medications for this visit.    Allergies as of 04/18/2022   (No Known Allergies)    Family History  Problem Relation Age of Onset   Severe sprains Neg Hx    Scoliosis Neg Hx     Social History   Socioeconomic History   Marital status: Significant Other    Spouse name: Not on file   Number of children: Not on file   Years of education: Not on file   Highest education level: Not on file  Occupational History   Not on file  Tobacco Use   Smoking status: Never   Smokeless tobacco: Never  Vaping Use   Vaping Use: Never used  Substance and Sexual Activity   Alcohol use: Yes    Comment: rare   Drug use: Never   Sexual activity: Not on file  Other Topics Concern   Not on file  Social History Narrative   Not on file   Social Determinants of Health   Financial Resource Strain: Low Risk  (08/08/2020)   Overall Financial Resource Strain (CARDIA)    Difficulty of Paying Living Expenses: Not hard at all  Food Insecurity: No Food Insecurity (08/08/2020)   Hunger Vital Sign    Worried About Running Out of Food in the Last Year: Never true    Ran Out of Food in the Last Year: Never true  Transportation Needs: No Transportation Needs (08/08/2020)   PRAPARE - Hydrologist (Medical): No    Lack of Transportation (Non-Medical): No  Physical Activity: Inactive (08/08/2020)   Exercise Vital Sign    Days of Exercise per Week: 0 days    Minutes of Exercise per Session: 0 min  Stress: No  Stress Concern Present (08/08/2020)   [place]    Feeling of Stress : Not at all  Social Connections: Moderately Isolated (08/08/2020)   Social Connection and Isolation Panel [NHANES]    Frequency of Communication with Friends and Family: More than three times a week    Frequency of Social Gatherings with Friends and Family: More than three times a week    Attends Religious Services: Never    Marine scientist or Organizations: No    Attends Archivist Meetings: Never  Marital Status: Living with partner    Subjective: Review of Systems  Constitutional:  Negative for chills and fever.  HENT:  Negative for congestion and hearing loss.   Eyes:  Negative for blurred vision and double vision.  Respiratory:  Negative for cough and shortness of breath.   Cardiovascular:  Negative for chest pain and palpitations.  Gastrointestinal:  Negative for abdominal pain, blood in stool, constipation, diarrhea, heartburn, melena and vomiting.  Genitourinary:  Negative for dysuria and urgency.  Musculoskeletal:  Negative for joint pain and myalgias.  Skin:  Negative for itching and rash.  Neurological:  Negative for dizziness and headaches.  Psychiatric/Behavioral:  Negative for depression. The patient is not nervous/anxious.      Objective: BP 127/69   Pulse 98   Temp (!) 97.3 F (36.3 C)   Ht '5\' 5"'$  (1.651 m)   Wt 211 lb 9.6 oz (96 kg)   BMI 35.21 kg/m  Physical Exam Constitutional:      Appearance: Normal appearance.  HENT:     Head: Normocephalic and atraumatic.  Eyes:     Extraocular Movements: Extraocular movements intact.     Conjunctiva/sclera: Conjunctivae normal.  Cardiovascular:     Rate and Rhythm: Normal rate and regular rhythm.  Pulmonary:     Effort: Pulmonary effort is normal.     Breath sounds: Normal breath sounds.  Abdominal:     General: Bowel sounds are normal.     Palpations:  Abdomen is soft.  Musculoskeletal:        General: Normal range of motion.     Cervical back: Normal range of motion and neck supple.  Skin:    General: Skin is warm.  Neurological:     General: No focal deficit present.     Mental Status: He is alert and oriented to person, place, and time.  Psychiatric:        Mood and Affect: Mood normal.        Behavior: Behavior normal.      Assessment: *IBS-diarrhea predominant  *Abdominal pain-intermittent  *Adenomatous colon polyp  Plan: Symptoms likely related to IBS with diarrhea.  Will start on Bentyl and see how he does.  Information printed on low FODMAP diet.  Recommended he start taking over-the-counter Benefiber.  Colonoscopy recall 2026 for surveillance purposes.  Follow-up in 3 months   04/18/2022 2:09 PM   Disclaimer: This note was dictated with voice recognition software. Similar sounding words can inadvertently be transcribed and may not be corrected upon review.

## 2022-04-18 NOTE — Patient Instructions (Signed)
For your IBS, I am going to start you on a new medication called dicyclomine.  You can take this up to 4 times a day.  I would start it twice a day and see how you do and titrate from there.  I also recommend increasing fiber in your diet or adding OTC Benefiber/Metamucil. Be sure to drink at least 4 to 6 glasses of water daily.   I will print off a low FODMAP diet as well which may give you some insight on certain foods to avoid.  Follow-up in 3 to 4 months.  It was very nice seeing you again today.  Dr. Abbey Chatters

## 2022-04-23 DIAGNOSIS — H2512 Age-related nuclear cataract, left eye: Secondary | ICD-10-CM | POA: Diagnosis not present

## 2022-04-23 DIAGNOSIS — H25043 Posterior subcapsular polar age-related cataract, bilateral: Secondary | ICD-10-CM | POA: Diagnosis not present

## 2022-04-23 DIAGNOSIS — H18413 Arcus senilis, bilateral: Secondary | ICD-10-CM | POA: Diagnosis not present

## 2022-04-23 DIAGNOSIS — H2513 Age-related nuclear cataract, bilateral: Secondary | ICD-10-CM | POA: Diagnosis not present

## 2022-04-23 DIAGNOSIS — H25013 Cortical age-related cataract, bilateral: Secondary | ICD-10-CM | POA: Diagnosis not present

## 2022-05-20 DIAGNOSIS — E785 Hyperlipidemia, unspecified: Secondary | ICD-10-CM | POA: Diagnosis not present

## 2022-05-20 DIAGNOSIS — E1129 Type 2 diabetes mellitus with other diabetic kidney complication: Secondary | ICD-10-CM | POA: Diagnosis not present

## 2022-05-20 DIAGNOSIS — I10 Essential (primary) hypertension: Secondary | ICD-10-CM | POA: Diagnosis not present

## 2022-05-20 DIAGNOSIS — D509 Iron deficiency anemia, unspecified: Secondary | ICD-10-CM | POA: Diagnosis not present

## 2022-05-20 DIAGNOSIS — I700 Atherosclerosis of aorta: Secondary | ICD-10-CM | POA: Diagnosis not present

## 2022-05-20 DIAGNOSIS — N1832 Chronic kidney disease, stage 3b: Secondary | ICD-10-CM | POA: Diagnosis not present

## 2022-05-20 DIAGNOSIS — D510 Vitamin B12 deficiency anemia due to intrinsic factor deficiency: Secondary | ICD-10-CM | POA: Diagnosis not present

## 2022-05-20 DIAGNOSIS — Z79899 Other long term (current) drug therapy: Secondary | ICD-10-CM | POA: Diagnosis not present

## 2022-05-20 DIAGNOSIS — Z125 Encounter for screening for malignant neoplasm of prostate: Secondary | ICD-10-CM | POA: Diagnosis not present

## 2022-05-20 DIAGNOSIS — I447 Left bundle-branch block, unspecified: Secondary | ICD-10-CM | POA: Diagnosis not present

## 2022-05-27 ENCOUNTER — Ambulatory Visit (HOSPITAL_COMMUNITY)
Admission: RE | Admit: 2022-05-27 | Discharge: 2022-05-27 | Disposition: A | Discharge status: Home / Self Care | Payer: Medicare HMO | Source: Ambulatory Visit | Attending: Internal Medicine | Admitting: Internal Medicine

## 2022-05-27 ENCOUNTER — Other Ambulatory Visit (HOSPITAL_COMMUNITY): Payer: Self-pay | Admitting: Internal Medicine

## 2022-05-27 DIAGNOSIS — I700 Atherosclerosis of aorta: Secondary | ICD-10-CM | POA: Diagnosis not present

## 2022-05-27 DIAGNOSIS — R0602 Shortness of breath: Secondary | ICD-10-CM | POA: Diagnosis not present

## 2022-05-27 DIAGNOSIS — I447 Left bundle-branch block, unspecified: Secondary | ICD-10-CM | POA: Diagnosis not present

## 2022-05-27 DIAGNOSIS — R7309 Other abnormal glucose: Secondary | ICD-10-CM | POA: Diagnosis not present

## 2022-05-27 DIAGNOSIS — E785 Hyperlipidemia, unspecified: Secondary | ICD-10-CM | POA: Diagnosis not present

## 2022-05-27 DIAGNOSIS — E1122 Type 2 diabetes mellitus with diabetic chronic kidney disease: Secondary | ICD-10-CM | POA: Diagnosis not present

## 2022-05-27 DIAGNOSIS — I10 Essential (primary) hypertension: Secondary | ICD-10-CM | POA: Diagnosis not present

## 2022-05-27 DIAGNOSIS — Z6822 Body mass index (BMI) 22.0-22.9, adult: Secondary | ICD-10-CM | POA: Diagnosis not present

## 2022-06-14 DIAGNOSIS — H25012 Cortical age-related cataract, left eye: Secondary | ICD-10-CM | POA: Diagnosis not present

## 2022-06-14 DIAGNOSIS — H2511 Age-related nuclear cataract, right eye: Secondary | ICD-10-CM | POA: Diagnosis not present

## 2022-06-14 DIAGNOSIS — H2512 Age-related nuclear cataract, left eye: Secondary | ICD-10-CM | POA: Diagnosis not present

## 2022-07-09 ENCOUNTER — Encounter: Payer: Self-pay | Admitting: *Deleted

## 2022-07-12 DIAGNOSIS — H5203 Hypermetropia, bilateral: Secondary | ICD-10-CM | POA: Diagnosis not present

## 2022-07-12 DIAGNOSIS — H2511 Age-related nuclear cataract, right eye: Secondary | ICD-10-CM | POA: Diagnosis not present

## 2022-07-12 DIAGNOSIS — H25011 Cortical age-related cataract, right eye: Secondary | ICD-10-CM | POA: Diagnosis not present

## 2022-07-12 DIAGNOSIS — H52223 Regular astigmatism, bilateral: Secondary | ICD-10-CM | POA: Diagnosis not present

## 2022-07-12 DIAGNOSIS — H25012 Cortical age-related cataract, left eye: Secondary | ICD-10-CM | POA: Diagnosis not present

## 2022-07-16 DIAGNOSIS — L988 Other specified disorders of the skin and subcutaneous tissue: Secondary | ICD-10-CM | POA: Diagnosis not present

## 2022-07-18 DIAGNOSIS — D485 Neoplasm of uncertain behavior of skin: Secondary | ICD-10-CM | POA: Diagnosis not present

## 2022-07-31 ENCOUNTER — Encounter: Payer: Self-pay | Admitting: Emergency Medicine

## 2022-07-31 ENCOUNTER — Ambulatory Visit
Admission: EM | Admit: 2022-07-31 | Discharge: 2022-07-31 | Disposition: A | Discharge status: Home / Self Care | Payer: Medicare HMO | Attending: Family Medicine | Admitting: Family Medicine

## 2022-07-31 DIAGNOSIS — R1011 Right upper quadrant pain: Secondary | ICD-10-CM

## 2022-07-31 MED ORDER — TIZANIDINE HCL 2 MG PO CAPS: 2.0000 mg | ORAL_CAPSULE | Freq: Three times a day (TID) | ORAL | 0 refills | Status: DC | PRN

## 2022-07-31 NOTE — ED Triage Notes (Signed)
Right upper ABD pain since yesterday.  Denies nausea, vomited and diarrhea.  States pain is worse when moving.   Took tylenol with some relief

## 2022-07-31 NOTE — ED Provider Notes (Signed)
[facility]    CSN: [id number] Arrival date & time: 07/31/22  1541      History   Chief Complaint No chief complaint on file.   HPI William Washington is a 76 y.o. male.   Patient presenting today with 1 day history of right upper quadrant abdominal pain worse with movement, deep breaths, stretching.  Denies associated fever, chills, sweats, nausea, vomiting, diarrhea, constipation.  States he has been moving some heavy boxes around recently but otherwise nothing new that he can think of that could be causing symptoms.  Took some Aleve with mild temporary relief of symptoms.    Past Medical History:  Diagnosis Date   Diabetes mellitus without complication ([place])    Hypercholesteremia    Hypertension    Sleep apnea     Patient Active Problem List   Diagnosis Date Noted   Hyperlipidemia 06/13/2020   Iron deficiency anemia 05/31/2020   Microcytosis 05/31/2020   Diabetes ([place]) 05/31/2020   Hypertension 05/31/2020    Past Surgical History:  Procedure Laterality Date   COLONOSCOPY WITH PROPOFOL N/A 07/07/2020   Surgeon: Eloise Harman, DO; nonbleeding internal hemorrhoids, diverticulosis in the sigmoid and descending colon, Four 3-5 mm tubular adenomas.  Repeat in 5 years (2026).   CYSTOSCOPY WITH RETROGRADE PYELOGRAM, URETEROSCOPY AND STENT PLACEMENT Right 06/07/2021   Procedure: CYSTOSCOPY WITH RETROGRADE PYELOGRAM, URETEROSCOPY AND STENT PLACEMENT;  Surgeon: Cleon Gustin, MD;  Location: [facility];  Service: Urology;  Laterality: Right;   EXTRACORPOREAL SHOCK WAVE LITHOTRIPSY Right 05/08/2021   Procedure: EXTRACORPOREAL SHOCK WAVE LITHOTRIPSY (ESWL);  Surgeon: Cleon Gustin, MD;  Location: [facility];  Service: Urology;  Laterality: Right;   HOLMIUM LASER APPLICATION Right 62/13/0865   Procedure: HOLMIUM LASER APPLICATION;  Surgeon: Cleon Gustin, MD;  Location: [facility];  Service: Urology;  Laterality: Right;   MENISCECTOMY Left    ORIF FEMUR  FRACTURE Left    POLYPECTOMY  07/07/2020   Procedure: POLYPECTOMY;  Surgeon: Eloise Harman, DO;  [facility];  Service: Endoscopy;;       Home Medications    Prior to Admission medications   Medication Sig Start Date End Date Taking? Authorizing Provider  tizanidine (ZANAFLEX) 2 MG capsule Take 1 capsule (2 mg total) by mouth 3 (three) times daily as needed for muscle spasms. Do not drink alcohol or drive while taking this medication.  May cause drowsiness. 07/31/22  Yes Volney American, PA-C  aspirin EC 81 MG tablet Take 81 mg by mouth daily. Swallow whole.    [provider]  atorvastatin (LIPITOR) 40 MG tablet Take 40 mg by mouth at bedtime.     [provider]  Cholecalciferol (EQL VITAMIN D3) 50 MCG (2000 UT) CAPS Take 2,000 Units by mouth daily.    [provider]  diclofenac Sodium (VOLTAREN) 1 % GEL Apply 2 g topically daily.    [provider]  dicyclomine (BENTYL) 10 MG capsule Take 1 capsule (10 mg total) by mouth 4 (four) times daily -  before meals and at bedtime. 04/18/22 10/15/22  Eloise Harman, DO  hydroxypropyl methylcellulose / hypromellose (ISOPTO TEARS / GONIOVISC) 2.5 % ophthalmic solution Place 1 drop into both eyes 3 (three) times daily as needed for dry eyes.    [provider]  loratadine-pseudoephedrine (CLARITIN-D 24-HOUR) 10-240 MG 24 hr tablet Take 1 tablet by mouth every other day.     [provider]  Omega-3 Fatty Acids (FISH OIL) 1000 MG  CAPS Take 2,000 mg by mouth daily.    [provider]  oxybutynin (DITROPAN) 5 MG tablet Take 1 tablet (5 mg total) by mouth every 8 (eight) hours as needed for up to 15 doses (Urinary frequency/urgency). 04/03/21   Franchot Gallo, MD  pioglitazone (ACTOS) 30 MG tablet Take 30 mg by mouth daily.    [provider]  ramipril (ALTACE) 5 MG capsule Take 5 mg by mouth daily.    [provider]  sodium chloride (OCEAN) 0.65 %  SOLN nasal spray Place 1 spray into both nostrils as needed for congestion.    [provider]  TRESIBA FLEXTOUCH 100 UNIT/ML FlexTouch Pen Inject 25 Units into the skin at bedtime. 07/12/20   [provider]  Turmeric 500 MG CAPS Take 1,000 mg by mouth daily.     [provider]  vitamin B-12 (CYANOCOBALAMIN) 1000 MCG tablet Take 1,000 mcg by mouth daily.    [provider]    Family History Family History  Problem Relation Age of Onset   Severe sprains Neg Hx    Scoliosis Neg Hx     Social History Social History   Tobacco Use   Smoking status: Never   Smokeless tobacco: Never  Vaping Use   Vaping Use: Never used  Substance Use Topics   Alcohol use: Yes    Comment: rare   Drug use: Never     Allergies   Patient has no known allergies.   Review of Systems Review of Systems Per HPI  Physical Exam Triage Vital Signs ED Triage Vitals  Enc Vitals Group     BP 07/31/22 1551 (!) 145/66     Pulse Rate 07/31/22 1551 (!) 102     Resp 07/31/22 1551 18     Temp 07/31/22 1551 98 F (36.7 C)     Temp Source 07/31/22 1551 Oral     SpO2 07/31/22 1551 94 %     Weight --      Height --      Head Circumference --      Peak Flow --      Pain Score 07/31/22 1550 8     Pain Loc --      Pain Edu? --      Excl. in [place]? --    No data found.  Updated Vital Signs BP (!) 145/66 (BP Location: Right Arm)   Pulse (!) 102   Temp 98 F (36.7 C) (Oral)   Resp 18   SpO2 94%   Visual Acuity Right Eye Distance:   Left Eye Distance:   Bilateral Distance:    Right Eye Near:   Left Eye Near:    Bilateral Near:     Physical Exam Vitals and nursing note reviewed.  Constitutional:      Appearance: Normal appearance.  HENT:     Head: Atraumatic.     Mouth/Throat:     Mouth: Mucous membranes are moist.  Eyes:     Extraocular Movements: Extraocular movements intact.     Conjunctiva/sclera: Conjunctivae normal.  Cardiovascular:     Rate and  Rhythm: Normal rate and regular rhythm.  Pulmonary:     Effort: Pulmonary effort is normal.     Breath sounds: Normal breath sounds.  Abdominal:     General: Bowel sounds are normal. There is no distension.     Palpations: Abdomen is soft.     Tenderness: There is abdominal tenderness. There is no right CVA tenderness, left  CVA tenderness or guarding.     Comments: Epigastric, right upper quadrant tenderness to palpation without distention or guarding.  Negative Murphy's sign  Musculoskeletal:        General: Normal range of motion.     Cervical back: Normal range of motion and neck supple.  Skin:    General: Skin is warm and dry.  Neurological:     General: No focal deficit present.     Mental Status: He is oriented to person, place, and time.  Psychiatric:        Mood and Affect: Mood normal.        Thought Content: Thought content normal.        Judgment: Judgment normal.      UC Treatments / Results  Labs (all labs ordered are listed, but only abnormal results are displayed) Labs Reviewed - No data to display  EKG   Radiology No results found.  Procedures Procedures (including critical care time)  Medications Ordered in UC Medications - No data to display  Initial Impression / Assessment and Plan / UC Course  I have reviewed the triage vital signs and the nursing notes.  Pertinent labs & imaging results that were available during my care of the patient were reviewed by me and considered in my medical decision making (see chart for details).     Mildly hypertensive in triage, otherwise vital signs benign and reassuring.  Exam reassuring without red flag findings.  Given nature of pain, suspect muscular strain.  Treat with Zanaflex, anti-inflammatory pain medications and heat.  Discussed to go the emergency department if symptoms significantly worsening for abdominal imaging.  Final Clinical Impressions(s) / UC Diagnoses   Final diagnoses:  RUQ abdominal pain      Discharge Instructions      I suspect you are current pain is related to a strained muscle.  I have sent over a muscle relaxer, take Aleve and Tylenol as needed and apply heat to the area off-and-on.  Follow-up in the emergency department if your symptoms worsen.    ED Prescriptions     Medication Sig Dispense Auth. Provider   tizanidine (ZANAFLEX) 2 MG capsule Take 1 capsule (2 mg total) by mouth 3 (three) times daily as needed for muscle spasms. Do not drink alcohol or drive while taking this medication.  May cause drowsiness. 15 capsule Volney American, [place]      PDMP not reviewed this encounter.   Volney American, [place] 07/31/22 1655

## 2022-07-31 NOTE — Discharge Instructions (Signed)
I suspect you are current pain is related to a strained muscle.  I have sent over a muscle relaxer, take Aleve and Tylenol as needed and apply heat to the area off-and-on.  Follow-up in the emergency department if your symptoms worsen.

## 2022-08-05 ENCOUNTER — Emergency Department (HOSPITAL_COMMUNITY): Payer: Medicare HMO

## 2022-08-05 ENCOUNTER — Encounter (HOSPITAL_COMMUNITY): Payer: Self-pay

## 2022-08-05 ENCOUNTER — Emergency Department (HOSPITAL_COMMUNITY)
Admission: EM | Admit: 2022-08-05 | Discharge: 2022-08-05 | Disposition: A | Discharge status: Home / Self Care | Payer: Medicare HMO | Attending: Emergency Medicine | Admitting: Emergency Medicine

## 2022-08-05 DIAGNOSIS — E119 Type 2 diabetes mellitus without complications: Secondary | ICD-10-CM | POA: Insufficient documentation

## 2022-08-05 DIAGNOSIS — R1011 Right upper quadrant pain: Secondary | ICD-10-CM | POA: Insufficient documentation

## 2022-08-05 DIAGNOSIS — Z7982 Long term (current) use of aspirin: Secondary | ICD-10-CM | POA: Diagnosis not present

## 2022-08-05 DIAGNOSIS — I700 Atherosclerosis of aorta: Secondary | ICD-10-CM | POA: Diagnosis not present

## 2022-08-05 DIAGNOSIS — Z79899 Other long term (current) drug therapy: Secondary | ICD-10-CM | POA: Diagnosis not present

## 2022-08-05 DIAGNOSIS — D649 Anemia, unspecified: Secondary | ICD-10-CM | POA: Insufficient documentation

## 2022-08-05 DIAGNOSIS — Z7984 Long term (current) use of oral hypoglycemic drugs: Secondary | ICD-10-CM | POA: Insufficient documentation

## 2022-08-05 DIAGNOSIS — F17200 Nicotine dependence, unspecified, uncomplicated: Secondary | ICD-10-CM | POA: Insufficient documentation

## 2022-08-05 DIAGNOSIS — K55069 Acute infarction of intestine, part and extent unspecified: Secondary | ICD-10-CM

## 2022-08-05 DIAGNOSIS — R110 Nausea: Secondary | ICD-10-CM | POA: Diagnosis not present

## 2022-08-05 DIAGNOSIS — R079 Chest pain, unspecified: Secondary | ICD-10-CM | POA: Diagnosis not present

## 2022-08-05 DIAGNOSIS — I10 Essential (primary) hypertension: Secondary | ICD-10-CM | POA: Diagnosis not present

## 2022-08-05 DIAGNOSIS — R109 Unspecified abdominal pain: Secondary | ICD-10-CM | POA: Diagnosis not present

## 2022-08-05 LAB — CBC
HCT: 28.1 % — ABNORMAL LOW (ref 39.0–52.0)
Hemoglobin: 8.8 g/dL — ABNORMAL LOW (ref 13.0–17.0)
MCH: 20 pg — ABNORMAL LOW (ref 26.0–34.0)
MCHC: 31.3 g/dL (ref 30.0–36.0)
MCV: 64 fL — ABNORMAL LOW (ref 80.0–100.0)
Platelets: 225 10*3/uL (ref 150–400)
RBC: 4.39 MIL/uL (ref 4.22–5.81)
RDW: 16.2 % — ABNORMAL HIGH (ref 11.5–15.5)
WBC: 7.6 10*3/uL (ref 4.0–10.5)
nRBC: 0 % (ref 0.0–0.2)

## 2022-08-05 LAB — URINALYSIS, ROUTINE W REFLEX MICROSCOPIC
Bacteria, UA: NONE SEEN
Bilirubin Urine: NEGATIVE
Glucose, UA: NEGATIVE mg/dL
Ketones, ur: NEGATIVE mg/dL
Leukocytes,Ua: NEGATIVE
Nitrite: NEGATIVE
Protein, ur: NEGATIVE mg/dL
Specific Gravity, Urine: 1.021 (ref 1.005–1.030)
pH: 6 (ref 5.0–8.0)

## 2022-08-05 LAB — COMPREHENSIVE METABOLIC PANEL
ALT: 19 U/L (ref 0–44)
AST: 19 U/L (ref 15–41)
Albumin: 3.1 g/dL — ABNORMAL LOW (ref 3.5–5.0)
Alkaline Phosphatase: 57 U/L (ref 38–126)
Anion gap: 8 (ref 5–15)
BUN: 20 mg/dL (ref 8–23)
CO2: 24 mmol/L (ref 22–32)
Calcium: 8.7 mg/dL — ABNORMAL LOW (ref 8.9–10.3)
Chloride: 105 mmol/L (ref 98–111)
Creatinine, Ser: 1.14 mg/dL (ref 0.61–1.24)
GFR, Estimated: >60 mL/min (ref >60)
Glucose, Bld: 182 mg/dL — ABNORMAL HIGH (ref 70–99)
Potassium: 3.9 mmol/L (ref 3.5–5.1)
Sodium: 137 mmol/L (ref 135–145)
Total Bilirubin: 0.8 mg/dL (ref 0.3–1.2)
Total Protein: 6.8 g/dL (ref 6.5–8.1)

## 2022-08-05 LAB — LIPASE, BLOOD: Lipase: 54 U/L — ABNORMAL HIGH (ref 11–51)

## 2022-08-05 LAB — LACTIC ACID, PLASMA: Lactic Acid, Venous: 0.8 mmol/L (ref 0.5–1.9)

## 2022-08-05 LAB — TROPONIN I (HIGH SENSITIVITY)
Troponin I (High Sensitivity): 4 ng/L (ref <18)
Troponin I (High Sensitivity): 5 ng/L (ref <18)

## 2022-08-05 LAB — OCCULT BLOOD X 1 CARD TO LAB, STOOL: Fecal Occult Bld: POSITIVE — AB

## 2022-08-05 MED ORDER — IBUPROFEN 600 MG PO TABS: 600.0000 mg | ORAL_TABLET | Freq: Three times a day (TID) | ORAL | 0 refills | Status: DC | PRN

## 2022-08-05 MED ORDER — LIDOCAINE VISCOUS HCL 2 % MT SOLN
15.0000 mL | Freq: Once | OROMUCOSAL | Status: AC
  Administered 2022-08-05: 15 mL via ORAL
  Filled 2022-08-05: qty 15

## 2022-08-05 MED ORDER — ALUM & MAG HYDROXIDE-SIMETH 200-200-20 MG/5ML PO SUSP
30.0000 mL | Freq: Once | ORAL | Status: AC
  Administered 2022-08-05: 30 mL via ORAL
  Filled 2022-08-05: qty 30

## 2022-08-05 MED ORDER — PANTOPRAZOLE SODIUM 40 MG IV SOLR
40.0000 mg | Freq: Once | INTRAVENOUS | Status: AC
  Administered 2022-08-05: 40 mg via INTRAVENOUS
  Filled 2022-08-05: qty 10

## 2022-08-05 MED ORDER — ACETAMINOPHEN 325 MG PO TABS: 650.0000 mg | ORAL_TABLET | Freq: Four times a day (QID) | ORAL | 0 refills | Status: DC | PRN

## 2022-08-05 MED ORDER — SODIUM CHLORIDE 0.9 % IV BOLUS
1000.0000 mL | Freq: Once | INTRAVENOUS | Status: AC
  Administered 2022-08-05: 1000 mL via INTRAVENOUS

## 2022-08-05 MED ORDER — IBUPROFEN 400 MG PO TABS
600.0000 mg | ORAL_TABLET | Freq: Once | ORAL | Status: AC
  Administered 2022-08-05: 600 mg via ORAL
  Filled 2022-08-05: qty 2

## 2022-08-05 MED ORDER — PANTOPRAZOLE SODIUM 20 MG PO TBEC: 20.0000 mg | DELAYED_RELEASE_TABLET | Freq: Every day | ORAL | 0 refills | Status: DC

## 2022-08-05 MED ORDER — IOHEXOL 300 MG/ML  SOLN
100.0000 mL | Freq: Once | INTRAMUSCULAR | Status: AC | PRN
  Administered 2022-08-05: 100 mL via INTRAVENOUS

## 2022-08-05 NOTE — ED Provider Notes (Signed)
Patient is here complaining of right upper quadrant abdominal pain, CT findings which could be consistent with an omental infarct.  Hemoccult is mildly positive, but hemoglobin is at baseline, the patient is on iron tablets for chronic anemia.  Earlier ED provider had placed a consult call to general surgery regarding the omental infarct.  Plan to follow-up on this conversation.  Patient otherwise is stable, pain is controlled.  *  6 pm - I spoke to Dr. Arnoldo Morale from general surgery reported that there is no acute intervention required for this, omental infarcts are conservatively managed.  If the patient has persistent pain he can follow-up in the office.  I reassessed the patient, with his family at the bedside, and he feels that his pain can be well controlled with Motrin and Tylenol at home, which I agree with.  He is okay for discharge.  We have discussed all of his findings.   [name], MD 08/05/22 [phone]

## 2022-08-05 NOTE — Discharge Instructions (Addendum)
It was a pleasure caring for you today in the emergency department.  Your CT scan showed that you have a "omental infarct".  This is a loss of blood flow to the part of the tissue that lines your abdomen.  This is an incidental and not a life-threatening finding.  It is generally managed conservatively, with pain medication at home, and the pain often goes away after several weeks for most patients.  If you do have persistent pain after 4 weeks, you can schedule a follow-up appointment with a general surgeon at the number above.  You can continue taking ibuprofen and Tylenol at home, together as needed for pain control.  You can continue eating regularly.  Please note that your blood test did show that you have some anemia today.  This is a chronic issue.  Your hemoglobin blood count has been low for the past several months per our review of the records.  You should follow-up with your doctor for this to keep an eye on your hemoglobin blood count.

## 2022-08-05 NOTE — ED Provider Notes (Signed)
[facility] Provider Note   CSN: [id number] Arrival date & time: 08/05/22  0941     History  Chief Complaint  Patient presents with   Abdominal Pain    William Washington is a 76 y.o. male.  Patient as above with significant medical history as below, including DM, HLD, HTN, osa who presents to the ED with complaint of ruq pain. Onset around 1 wk ago, ruq, non-radiating. He took some pepto bismol the night prior because he had some mild nausea/ upset stomach sensation that did improve with the pepto. Went to UC was started on tizanidine without much improvement to his symptoms. Pain is worsened with torso twisting or movement. No n/v ongoing. Noticed dark stools last few days. He takes iron and had recent pepto bismol, feels stool is dark brown/almost black mixed w/ brown, no pain with defecation. No hx gi eval or egd in past per pt. No excessive nsaid/ etoh / tobacco use reported. Pain is sharp, occ stabbing, does not radiate.      Past Medical History:  Diagnosis Date   Diabetes mellitus without complication ([place])    Hypercholesteremia    Hypertension    Sleep apnea     Past Surgical History:  Procedure Laterality Date   COLONOSCOPY WITH PROPOFOL N/A 07/07/2020   Surgeon: Eloise Harman, DO; nonbleeding internal hemorrhoids, diverticulosis in the sigmoid and descending colon, Four 3-5 mm tubular adenomas.  Repeat in 5 years (2026).   CYSTOSCOPY WITH RETROGRADE PYELOGRAM, URETEROSCOPY AND STENT PLACEMENT Right 06/07/2021   Procedure: CYSTOSCOPY WITH RETROGRADE PYELOGRAM, URETEROSCOPY AND STENT PLACEMENT;  Surgeon: Cleon Gustin, MD;  Location: [facility];  Service: Urology;  Laterality: Right;   EXTRACORPOREAL SHOCK WAVE LITHOTRIPSY Right 05/08/2021   Procedure: EXTRACORPOREAL SHOCK WAVE LITHOTRIPSY (ESWL);  Surgeon: Cleon Gustin, MD;  Location: [facility];  Service: Urology;  Laterality: Right;   HOLMIUM LASER APPLICATION Right [id number]    Procedure: HOLMIUM LASER APPLICATION;  Surgeon: Cleon Gustin, MD;  Location: [facility];  Service: Urology;  Laterality: Right;   MENISCECTOMY Left    ORIF FEMUR FRACTURE Left    POLYPECTOMY  07/07/2020   Procedure: POLYPECTOMY;  Surgeon: Eloise Harman, DO;  [facility];  Service: Endoscopy;;     The history is provided by the patient. No language interpreter was used.  Abdominal Pain Associated symptoms: nausea   Associated symptoms: no chest pain, no chills, no cough, no fever, no hematuria, no shortness of breath and no vomiting        Home Medications Prior to Admission medications   Medication Sig Start Date End Date Taking? Authorizing Provider  aspirin EC 81 MG tablet Take 81 mg by mouth daily. Swallow whole.   Yes [provider]  atorvastatin (LIPITOR) 40 MG tablet Take 40 mg by mouth at bedtime.    Yes [provider]  Cholecalciferol (EQL VITAMIN D3) 50 MCG (2000 UT) CAPS Take 2,000 Units by mouth daily.   Yes [provider]  diclofenac Sodium (VOLTAREN) 1 % GEL Apply 2 g topically daily.   Yes [provider]  Difluprednate 0.05 % EMUL Place 1 drop into the right eye daily. 06/17/22  Yes [provider]  hydroxypropyl methylcellulose / hypromellose (ISOPTO TEARS / GONIOVISC) 2.5 % ophthalmic solution Place 1 drop into both eyes 3 (three) times daily as needed for dry eyes.   Yes [provider]  ketorolac (ACULAR) 0.5 % ophthalmic solution Place 1 drop into  the right eye 4 (four) times daily. 06/17/22  Yes [provider]  loratadine-pseudoephedrine (CLARITIN-D 24-HOUR) 10-240 MG 24 hr tablet Take 1 tablet by mouth every other day.    Yes [provider]  oxybutynin (DITROPAN) 5 MG tablet Take 1 tablet (5 mg total) by mouth every 8 (eight) hours as needed for up to 15 doses (Urinary frequency/urgency). 04/03/21  Yes Dahlstedt, Annie Main, MD  pantoprazole (PROTONIX) 20 MG tablet Take 1 tablet  (20 mg total) by mouth daily for 14 days. 08/05/22 08/19/22 Yes Wynona Dove A, DO  pioglitazone (ACTOS) 30 MG tablet Take 30 mg by mouth daily.   Yes [provider]  ramipril (ALTACE) 5 MG capsule Take 5 mg by mouth daily.   Yes [provider]  sodium chloride (OCEAN) 0.65 % SOLN nasal spray Place 1 spray into both nostrils as needed for congestion.   Yes [provider]  tizanidine (ZANAFLEX) 2 MG capsule Take 1 capsule (2 mg total) by mouth 3 (three) times daily as needed for muscle spasms. Do not drink alcohol or drive while taking this medication.  May cause drowsiness. 07/31/22  Yes Volney American, PA-C  TRESIBA FLEXTOUCH 100 UNIT/ML FlexTouch Pen Inject 30 Units into the skin daily. 07/12/20  Yes [provider]  Turmeric 500 MG CAPS Take 1,000 mg by mouth daily.    Yes [provider]  vitamin B-12 (CYANOCOBALAMIN) 1000 MCG tablet Take 1,000 mcg by mouth daily.   Yes [provider]  dicyclomine (BENTYL) 10 MG capsule Take 1 capsule (10 mg total) by mouth 4 (four) times daily -  before meals and at bedtime. Patient not taking: Reported on 08/05/2022 04/18/22 10/15/22  Eloise Harman, DO  Omega-3 Fatty Acids (FISH OIL) 1000 MG CAPS Take 2,000 mg by mouth daily. Patient not taking: Reported on 08/05/2022    [provider]      Allergies    Patient has no known allergies.    Review of Systems   Review of Systems  Constitutional:  Negative for chills and fever.  HENT:  Negative for facial swelling and trouble swallowing.   Eyes:  Negative for photophobia and visual disturbance.  Respiratory:  Negative for cough and shortness of breath.   Cardiovascular:  Negative for chest pain and palpitations.  Gastrointestinal:  Positive for abdominal pain and nausea. Negative for vomiting.       Dark stool  Endocrine: Negative for polydipsia and polyuria.  Genitourinary:  Negative for difficulty urinating and hematuria.   Musculoskeletal:  Negative for gait problem and joint swelling.  Skin:  Negative for pallor and rash.  Neurological:  Negative for syncope and headaches.  Psychiatric/Behavioral:  Negative for agitation and confusion.     Physical Exam Updated Vital Signs BP 105/74   Pulse 84   Temp 98.1 F (36.7 C) (Oral) Comment: temp is late due this RN being in a code  Resp (!) 23   Ht '5\' 5"'$  (1.651 m)   Wt 95.3 kg   SpO2 95%   BMI 34.95 kg/m  Physical Exam Vitals and nursing note reviewed.  Constitutional:      General: He is not in acute distress.    Appearance: He is well-developed. He is obese.  HENT:     Head: Normocephalic and atraumatic.     Right Ear: External ear normal.     Left Ear: External ear normal.     Mouth/Throat:     Mouth: Mucous membranes are moist.  Eyes:     General: No scleral icterus. Cardiovascular:     Rate and Rhythm: Normal rate and regular rhythm.     Pulses: Normal pulses.     Heart sounds: Normal heart sounds.  Pulmonary:     Effort: Pulmonary effort is normal. No respiratory distress.     Breath sounds: Normal breath sounds.  Abdominal:     General: Abdomen is flat.     Palpations: Abdomen is soft.     Tenderness: There is abdominal tenderness in the right upper quadrant. There is no guarding or rebound.     Comments: Not peritoneal  Musculoskeletal:        General: Normal range of motion.     Cervical back: Normal range of motion.     Right lower leg: No edema.     Left lower leg: No edema.  Skin:    General: Skin is warm and dry.     Capillary Refill: Capillary refill takes less than 2 seconds.  Neurological:     Mental Status: He is alert and oriented to person, place, and time.     GCS: GCS eye subscore is 4. GCS verbal subscore is 5. GCS motor subscore is 6.  Psychiatric:        Mood and Affect: Mood normal.        Behavior: Behavior normal.     ED Results / Procedures / Treatments   Labs (all labs ordered are listed, but only  abnormal results are displayed) Labs Reviewed  LIPASE, BLOOD - Abnormal; Notable for the following components:      Result Value   Lipase 54 (*)    All other components within normal limits  COMPREHENSIVE METABOLIC PANEL - Abnormal; Notable for the following components:   Glucose, Bld 182 (*)    Calcium 8.7 (*)    Albumin 3.1 (*)    All other components within normal limits  CBC - Abnormal; Notable for the following components:   Hemoglobin 8.8 (*)    HCT 28.1 (*)    MCV 64.0 (*)    MCH 20.0 (*)    RDW 16.2 (*)    All other components within normal limits  URINALYSIS, ROUTINE W REFLEX MICROSCOPIC - Abnormal; Notable for the following components:   Color, Urine COLORLESS (*)    Hgb urine dipstick SMALL (*)    All other components within normal limits  OCCULT BLOOD X 1 CARD TO LAB, STOOL - Abnormal; Notable for the following components:   Fecal Occult Bld POSITIVE (*)    All other components within normal limits  LACTIC ACID, PLASMA  TROPONIN I (HIGH SENSITIVITY)  TROPONIN I (HIGH SENSITIVITY)    EKG EKG Interpretation  Date/Time:  Monday August 05 2022 09:57:24 EST Ventricular Rate:  95 PR Interval:  179 QRS Duration: 134 QT Interval:  363 QTC Calculation: 457 R Axis:   -75 Text Interpretation: Sinus rhythm Left bundle branch block Confirmed by Octaviano Glow [phone]) on 08/05/2022 3:39:45 PM  Radiology CT ABDOMEN PELVIS W CONTRAST  Result Date: 08/05/2022 CLINICAL DATA:  Acute right upper quadrant abdominal pain. EXAM: CT ABDOMEN AND PELVIS WITH CONTRAST TECHNIQUE: Multidetector CT imaging of the abdomen and pelvis was performed using the standard protocol following bolus administration of intravenous contrast. RADIATION DOSE REDUCTION: This exam was performed according to the departmental dose-optimization program which includes automated exposure control, adjustment of the mA and/or kV according to patient size and/or use of iterative reconstruction technique.  CONTRAST:  19m  OMNIPAQUE IOHEXOL 300 MG/ML  SOLN COMPARISON:  April 10, 2021. FINDINGS: Lower chest: No acute abnormality. Hepatobiliary: No focal liver abnormality is seen. No gallstones, gallbladder wall thickening, or biliary dilatation. Pancreas: Unremarkable. No pancreatic ductal dilatation or surrounding inflammatory changes. Spleen: Normal in size without focal abnormality. Adrenals/Urinary Tract: Adrenal glands appear normal. Bilateral nonobstructive nephrolithiasis is noted. Bilateral renal cysts are noted for which no further follow-up is required. Extensive left renal cortical scarring is noted in upper pole. Urinary bladder is unremarkable. Stomach/Bowel: The stomach appears normal. There is no evidence of bowel obstruction or inflammation. Vascular/Lymphatic: Aortic atherosclerosis. No enlarged abdominal or pelvic lymph nodes. Reproductive: Prostate is unremarkable. Other: Moderate size fat containing periumbilical hernia is noted. No ascites is noted. Ill-defined inflammatory changes are seen involving the peritoneal fat anterior to the right hepatic lobe which may represent omental infarction. Musculoskeletal: No acute or significant osseous findings. IMPRESSION: Ill-defined stranding is noted of the peritoneal fat anterior to the right hepatic lobe most consistent with omental infarction. Bilateral nonobstructive nephrolithiasis. Aortic Atherosclerosis (ICD10-I70.0). Electronically Signed   By: Marijo Conception M.D.   On: 08/05/2022 12:03   DG Chest Portable 1 View  Result Date: 08/05/2022 CLINICAL DATA:  Right upper quadrant abdominal pain. EXAM: PORTABLE CHEST 1 VIEW COMPARISON:  Chest radiograph 05/27/2022. FINDINGS: The heart is at the upper limits of normal for size. The upper mediastinal contours are normal. There is patchy retrocardiac opacity new since the prior study. There is no other focal airspace opacity. There is a possible small left pleural effusion. There is no right effusion.  There is no pneumothorax There is no acute osseous abnormality. There is no evidence of free intraperitoneal air under the diaphragm. IMPRESSION: Patchy retrocardiac opacity and possible small left pleural effusion could reflect pneumonia in the correct clinical setting. Recommend follow-up PA and lateral chest radiographs in 3-4 weeks to assess for resolution. Electronically Signed   By: Valetta Mole M.D.   On: 08/05/2022 10:49    Procedures Procedures    Medications Ordered in ED Medications  sodium chloride 0.9 % bolus 1,000 mL (0 mLs Intravenous Stopped 08/05/22 1232)  alum & mag hydroxide-simeth (MAALOX/MYLANTA) 200-200-20 MG/5ML suspension 30 mL (30 mLs Oral Given 08/05/22 1019)    And  lidocaine (XYLOCAINE) 2 % viscous mouth solution 15 mL (15 mLs Oral Given 08/05/22 1019)  pantoprazole (PROTONIX) injection 40 mg (40 mg Intravenous Given 08/05/22 1020)  iohexol (OMNIPAQUE) 300 MG/ML solution 100 mL (100 mLs Intravenous Contrast Given 08/05/22 1141)    ED Course/ Medical Decision Making/ A&P                           Medical Decision Making Amount and/or Complexity of Data Reviewed Labs: ordered. Radiology: ordered.  Risk OTC drugs. Prescription drug management.   This patient presents to the ED with chief complaint(s) of ruq pain with pertinent past medical history of as above which further complicates the presenting complaint. The complaint involves an extensive differential diagnosis and also carries with it a high risk of complications and morbidity.     Differential diagnosis includes but is not exclusive to acute cholecystitis, intrathoracic causes for epigastric abdominal pain, gastritis, duodenitis, pancreatitis, small bowel or large bowel obstruction, abdominal aortic aneurysm, hernia, gastritis, etc.  . Serious etiologies were considered.   The initial plan is to screening labs, gi cocktail, ivf   Additional history obtained: Additional history obtained from   na Records reviewed  Primary Care Documents and home meds, prior labs/imaging   Independent labs interpretation:  The following labs were independently interpreted:  Lipase is minimally elevated Metabolic panel stable   Independent visualization of imaging: - I independently visualized the following imaging with scope of interpretation limited to determining acute life threatening conditions related to emergency care: CXR, CTAP, which revealed possible omental infarction on CT, will d/w gen surg   Cardiac monitoring was reviewed and interpreted by myself which shows NSR  Treatment and Reassessment: Gi cocktail Ivf >> improved   Consultation: - Consulted or discussed management/test interpretation w/ external professional: paged gen surg on call, spoke with staff covering for dr Arnoldo Morale; they will take a look at images and call back  Consideration for admission or further workup: Admission was considered    Pt with abdominal pain ruq x1 week, symptoms have improved here in the ED. CT concerning for possible omental infarct. Abd is not peritoneal, no n/v ongoing. He does have positive hemoccult, IDA hx of, no sig drop to his hgb. Will give protonix. Give for 2 weeks, have him f/u with GI Dr Abbey Chatters in 2 weeks for re-check   Pt signed out to incoming edp pending eval by gen surg, he is HDS, abdomen is not peritoneal.   Social Determinants of health: Social History   Tobacco Use   Smoking status: Never   Smokeless tobacco: Never  Vaping Use   Vaping Use: Never used  Substance Use Topics   Alcohol use: Yes    Comment: rare   Drug use: Never            Final Clinical Impression(s) / ED Diagnoses Final diagnoses:  RUQ abdominal pain  Chronic anemia    Rx / DC Orders ED Discharge Orders          Ordered    pantoprazole (PROTONIX) 20 MG tablet  Daily        08/05/22 1657              Jeanell Sparrow, DO 08/05/22 1658

## 2022-08-05 NOTE — ED Triage Notes (Signed)
Pt c/o RUQ pain since Tuesday. Pt was seen at [facility] and given tizanidine without relief. Pt states he has dark stools. Denies nausea.

## 2022-08-15 ENCOUNTER — Telehealth: Payer: Self-pay

## 2022-08-15 NOTE — Telephone Encounter (Signed)
     Patient  visit on 11/27  at [facility] you been able to follow up with your primary care physician? Yes   The patient was or was not able to obtain any needed medicine or equipment. Yes   Are there diet recommendations that you are having difficulty following? Yes   Patient expresses understanding of discharge instructions and education provided has no other needs at this time.  Yes      [facility], [facility], Care Management  [phone] [address], [place], [place] [address] Phone: [phone] Email: [email or web address]

## 2022-08-21 DIAGNOSIS — I700 Atherosclerosis of aorta: Secondary | ICD-10-CM | POA: Diagnosis not present

## 2022-08-21 DIAGNOSIS — I10 Essential (primary) hypertension: Secondary | ICD-10-CM | POA: Diagnosis not present

## 2022-08-21 DIAGNOSIS — E1129 Type 2 diabetes mellitus with other diabetic kidney complication: Secondary | ICD-10-CM | POA: Diagnosis not present

## 2022-08-21 DIAGNOSIS — E785 Hyperlipidemia, unspecified: Secondary | ICD-10-CM | POA: Diagnosis not present

## 2022-08-28 DIAGNOSIS — I10 Essential (primary) hypertension: Secondary | ICD-10-CM | POA: Diagnosis not present

## 2022-08-28 DIAGNOSIS — E1122 Type 2 diabetes mellitus with diabetic chronic kidney disease: Secondary | ICD-10-CM | POA: Diagnosis not present

## 2022-08-28 DIAGNOSIS — J918 Pleural effusion in other conditions classified elsewhere: Secondary | ICD-10-CM | POA: Diagnosis not present

## 2022-08-28 DIAGNOSIS — R0602 Shortness of breath: Secondary | ICD-10-CM | POA: Diagnosis not present

## 2022-08-29 ENCOUNTER — Other Ambulatory Visit (HOSPITAL_COMMUNITY): Payer: Self-pay | Admitting: Internal Medicine

## 2022-08-29 DIAGNOSIS — R0602 Shortness of breath: Secondary | ICD-10-CM

## 2022-09-26 ENCOUNTER — Ambulatory Visit (HOSPITAL_COMMUNITY): Payer: Medicare HMO | Attending: Internal Medicine

## 2022-09-26 DIAGNOSIS — R0602 Shortness of breath: Secondary | ICD-10-CM | POA: Diagnosis not present

## 2022-09-26 LAB — ECHOCARDIOGRAM COMPLETE
AR max vel: 1.91 cm2
AV Area VTI: 2.02 cm2
AV Area mean vel: 2.02 cm2
AV Mean grad: 12 mmHg
AV Peak grad: 23.4 mmHg
Ao pk vel: 2.42 m/s
Area-P 1/2: 2.46 cm2
Calc EF: 49.4 %
S' Lateral: 3.1 cm
Single Plane A2C EF: 53.3 %
Single Plane A4C EF: 44.8 %

## 2022-09-26 MED ORDER — PERFLUTREN LIPID MICROSPHERE
3.0000 mL | INTRAVENOUS | Status: AC | PRN
  Administered 2022-09-26: 3 mL via INTRAVENOUS

## 2022-10-24 ENCOUNTER — Ambulatory Visit: Payer: Medicare HMO | Attending: Internal Medicine | Admitting: Internal Medicine

## 2022-10-24 ENCOUNTER — Encounter: Payer: Self-pay | Admitting: Internal Medicine

## 2022-10-24 VITALS — BP 138/80 | HR 80 | Ht 64.5 in | Wt 208.4 lb

## 2022-10-24 DIAGNOSIS — R0609 Other forms of dyspnea: Secondary | ICD-10-CM | POA: Diagnosis not present

## 2022-10-24 DIAGNOSIS — G4733 Obstructive sleep apnea (adult) (pediatric): Secondary | ICD-10-CM | POA: Diagnosis not present

## 2022-10-24 DIAGNOSIS — I080 Rheumatic disorders of both mitral and aortic valves: Secondary | ICD-10-CM | POA: Insufficient documentation

## 2022-10-24 DIAGNOSIS — R0602 Shortness of breath: Secondary | ICD-10-CM | POA: Insufficient documentation

## 2022-10-24 MED ORDER — METOPROLOL TARTRATE 100 MG PO TABS: 100.0000 mg | ORAL_TABLET | Freq: Once | ORAL | 0 refills | Status: DC

## 2022-10-24 NOTE — Progress Notes (Signed)
Cardiology Office Note  Date: 10/24/2022   ID: William Washington, DOB 20-Aug-1946, MRN [id number]  PCP:  Asencion Noble, MD  Cardiologist:  Chalmers Guest, MD Electrophysiologist:  None   Reason for Office Visit: Shortness of breath evaluation at the request of Dr. Willey Blade   History of Present Illness: William Washington is a 77 y.o. male known to have HTN, DM 2, HLD, OSA on CPAP was referred to cardiology clinic for evaluation of SOB.  Patient reported having SOB for many years but hide his wife noticed recent worsening of SOB for the patient. He can walk at his own pace with no issues of SOB.  It is only when he lifts weights or climbs stairs, he noticed having to breathe hard but this has been ongoing for many years anyway.  Denies any angina, dizziness, lightheadedness, syncope, palpitations or leg swelling.  Compliant with medications and also compliant with CPAP.  Did not smoke cigarettes but had second hand exposure to smoke.  Past Medical History:  Diagnosis Date   Diabetes mellitus without complication ([place])    Hypercholesteremia    Hypertension    Sleep apnea     Past Surgical History:  Procedure Laterality Date   COLONOSCOPY WITH PROPOFOL N/A 07/07/2020   Surgeon: Eloise Harman, DO; nonbleeding internal hemorrhoids, diverticulosis in the sigmoid and descending colon, Four 3-5 mm tubular adenomas.  Repeat in 5 years (2026).   CYSTOSCOPY WITH RETROGRADE PYELOGRAM, URETEROSCOPY AND STENT PLACEMENT Right 06/07/2021   Procedure: CYSTOSCOPY WITH RETROGRADE PYELOGRAM, URETEROSCOPY AND STENT PLACEMENT;  Surgeon: Cleon Gustin, MD;  Location[facility];  Service: Urology;  Laterality: Right;   EXTRACORPOREAL SHOCK WAVE LITHOTRIPSY Right 05/08/2021   Procedure: EXTRACORPOREAL SHOCK WAVE LITHOTRIPSY (ESWL);  Surgeon: Cleon Gustin, MD;  Location: [facility];  Service: Urology;  Laterality: Right;   HOLMIUM LASER APPLICATION Right [id number]   Procedure: HOLMIUM LASER  APPLICATION;  Surgeon: Cleon Gustin, MD;  Location: [facility];  Service: Urology;  Laterality: Right;   MENISCECTOMY Left    ORIF FEMUR FRACTURE Left    POLYPECTOMY  07/07/2020   Procedure: POLYPECTOMY;  Surgeon: Eloise Harman, DO;  [facility];  Service: Endoscopy;;    Current Outpatient Medications  Medication Sig Dispense Refill   acetaminophen (TYLENOL) 325 MG tablet Take 2 tablets (650 mg total) by mouth every 6 (six) hours as needed for up to 30 doses for mild pain or moderate pain. 30 tablet 0   aspirin EC 81 MG tablet Take 81 mg by mouth daily. Swallow whole.     atorvastatin (LIPITOR) 40 MG tablet Take 40 mg by mouth at bedtime.      Cholecalciferol (EQL VITAMIN D3) 50 MCG (2000 UT) CAPS Take 2,000 Units by mouth daily.     Difluprednate 0.05 % EMUL Place 1 drop into the right eye daily.     hydroxypropyl methylcellulose / hypromellose (ISOPTO TEARS / GONIOVISC) 2.5 % ophthalmic solution Place 1 drop into both eyes 3 (three) times daily as needed for dry eyes.     ibuprofen (ADVIL) 600 MG tablet Take 1 tablet (600 mg total) by mouth every 8 (eight) hours as needed for up to 30 doses for mild pain or moderate pain. 30 tablet 0   loratadine-pseudoephedrine (CLARITIN-D 24-HOUR) 10-240 MG 24 hr tablet Take 1 tablet by mouth every other day.      pioglitazone (ACTOS) 30 MG tablet Take 30 mg by mouth daily.     ramipril (  ALTACE) 5 MG capsule Take 5 mg by mouth daily.     TRESIBA FLEXTOUCH 100 UNIT/ML FlexTouch Pen Inject 30 Units into the skin daily.     vitamin B-12 (CYANOCOBALAMIN) 1000 MCG tablet Take 1,000 mcg by mouth daily.     oxybutynin (DITROPAN) 5 MG tablet Take 1 tablet (5 mg total) by mouth every 8 (eight) hours as needed for up to 15 doses (Urinary frequency/urgency). 60 tablet 11   sodium chloride (OCEAN) 0.65 % SOLN nasal spray Place 1 spray into both nostrils as needed for congestion.     tizanidine (ZANAFLEX) 2 MG capsule Take 1 capsule (2 mg total) by  mouth 3 (three) times daily as needed for muscle spasms. Do not drink alcohol or drive while taking this medication.  May cause drowsiness. 15 capsule 0   Turmeric 500 MG CAPS Take 1,000 mg by mouth daily.      No current facility-administered medications for this visit.   Allergies:  Patient has no known allergies.   Social History: The patient  reports that he has never smoked. He has never used smokeless tobacco. He reports current alcohol use. He reports that he does not use drugs.   Family History: The patient's family history is not on file.   ROS:  Please see the history of present illness. Otherwise, complete review of systems is positive for none.  All other systems are reviewed and negative.   Physical Exam: VS:  Ht 5' 4.5" (1.638 m)   Wt 208 lb 6.4 oz (94.5 kg)   BMI 35.22 kg/m , BMI Body mass index is 35.22 kg/m.  Wt Readings from Last 3 Encounters:  10/24/22 208 lb 6.4 oz (94.5 kg)  08/05/22 210 lb (95.3 kg)  04/18/22 211 lb 9.6 oz (96 kg)    General: Patient appears comfortable at rest. HEENT: Conjunctiva and lids normal, oropharynx clear with moist mucosa. Neck: Supple, no elevated JVP or carotid bruits, no thyromegaly. Lungs: Clear to auscultation, nonlabored breathing at rest. Cardiac: Regular rate and rhythm, no S3 or significant systolic murmur, no pericardial rub. Abdomen: Soft, nontender, no hepatomegaly, bowel sounds present, no guarding or rebound. Extremities: No pitting edema, distal pulses 2+. Skin: Warm and dry. Musculoskeletal: No kyphosis. Neuropsychiatric: Alert and oriented x3, affect grossly appropriate.  ECG:  An ECG dated 10/24/2022 was personally reviewed today and demonstrated:  Normal sinus rhythm, LBBB  Recent Labwork: 08/05/2022: ALT 19; AST 19; BUN 20; Creatinine, Ser 1.14; Hemoglobin 8.8; Platelets 225; Potassium 3.9; Sodium 137  No results found for: "CHOL", "TRIG", "HDL", "CHOLHDL", "VLDL", "LDLCALC", "LDLDIRECT"  Other Studies  Reviewed Today: Echocardiogram from 09/2022 LVEF 50 to 55% G1 DD Mild MR Mild aortic stenosis  Assessment and Plan: Patient is a 77 year old M known to have HTN, DM 2, HLD, OSA on CPAP was referred to cardiology clinic for evaluation of SOB.  # DOE -Patient has SOB/DOE for many years but his wife noticed recent worsening of his shortness of breath. He can walk at his own pace with no symptoms of SOB but when he lifts weights or climbs stairs, he has SOB but this has been ongoing for many years. Patient has risk factors of HTN, DM 2, secondary exposure to smoking with low normal LVEF 50 to 55%, he will benefit from noninvasive ischemia evaluation with CTA cardiac. No allergy to contrast and normal serum creatinine.   # HTN, controlled -Continue ramipril 5 mg once daily -Management of HTN per PCP  # OSA on CPAP -  Encouraged to be compliant with CPAP  # Valvular heart disease, mild MR and mild AS -Repeat echocardiogram in 3 years   I have spent a total of 45 minutes with patient reviewing chart, EKGs, labs and examining patient as well as establishing an assessment and plan that was discussed with the patient.  > 50% of time was spent in direct patient care.      Medication Adjustments/Labs and Tests Ordered: Current medicines are reviewed at length with the patient today.  Concerns regarding medicines are outlined above.   Tests Ordered: No orders of the defined types were placed in this encounter.   Medication Changes: No orders of the defined types were placed in this encounter.   Disposition:  Follow up  6 months  Signed Haskell Rihn Fidel Levy, MD, 10/24/2022 8:50 AM    [facility], [place], [place] [id number]

## 2022-10-24 NOTE — Patient Instructions (Addendum)
Medication Instructions:  Your physician recommends that you continue on your current medications as directed. Please refer to the Current Medication list given to you today.  Labwork: BMET in 1-2 weeks for [place] ([place]. [place]) Non-fasting  Testing/Procedures: Coronary CTA-see instructions below  Follow-Up: Your physician recommends that you schedule a follow-up appointment in: 6 months  Any Other Special Instructions Will Be Listed Below (If Applicable).  If you need a refill on your cardiac medications before your next appointment, please call your pharmacy.    Your cardiac CT will be scheduled at one of the below locations:   [facility] [address] [place], [place] [address] [phone]  If scheduled at [facility], please arrive at the [facility] and Children's Entrance (Entrance C2) of [facility] 30 minutes prior to test start time. You can use the FREE valet parking offered at entrance C (encouraged to control the heart rate for the test)  Proceed to the [facility] Radiology Department (first floor) to check-in and test prep.  All radiology patients and guests should use entrance C2 at [facility], accessed from [facility], even though the hospital's physical address listed is [address]    Please follow these instructions carefully (unless otherwise directed):  Hold all erectile dysfunction medications at least 3 days (72 hrs) prior to test. (Ie viagra, cialis, sildenafil, tadalafil, etc) We will administer nitroglycerin during this exam.   On the Night Before the Test: Be sure to Drink plenty of water. Do not consume any caffeinated/decaffeinated beverages or chocolate 12 hours prior to your test. Do not take any antihistamines 12 hours prior to your test. ([place]-D) If the patient has contrast allergy:  On the Day of the Test: Drink plenty of water until 1 hour prior to the  test. Do not eat any food 1 hour prior to test. You may take your regular medications prior to the test except Tresiba and pioglitozone  Take metoprolol (Lopressor) 100 mg two hours prior to test.      After the Test: Drink plenty of water. After receiving IV contrast, you may experience a mild flushed feeling. This is normal. On occasion, you may experience a mild rash up to 24 hours after the test. This is not dangerous. If this occurs, you can take Benadryl 25 mg and increase your fluid intake. If you experience trouble breathing, this can be serious. If it is severe call 911 IMMEDIATELY. If it is mild, please call our office.  We will call to schedule your test 2-4 weeks out understanding that some insurance companies will need an authorization prior to the service being performed.   For non-scheduling related questions, please contact the cardiac imaging nurse navigator should you have any questions/concerns: Marchia Bond, Cardiac Imaging Nurse Navigator Gordy Clement, Cardiac Imaging Nurse Navigator [facility] Direct Office Dial: [phone]   For scheduling needs, including cancellations and rescheduling, please call [place], [phone].

## 2022-10-30 DIAGNOSIS — R0609 Other forms of dyspnea: Secondary | ICD-10-CM | POA: Diagnosis not present

## 2022-10-31 ENCOUNTER — Ambulatory Visit (HOSPITAL_COMMUNITY): Payer: Medicare HMO

## 2022-10-31 LAB — BASIC METABOLIC PANEL
BUN/Creatinine Ratio: 16 (ref 10–24)
BUN: 17 mg/dL (ref 8–27)
CO2: 20 mmol/L (ref 20–29)
Calcium: 8.9 mg/dL (ref 8.6–10.2)
Chloride: 104 mmol/L (ref 96–106)
Creatinine, Ser: 1.04 mg/dL (ref 0.76–1.27)
Glucose: 89 mg/dL (ref 70–99)
Potassium: 4 mmol/L (ref 3.5–5.2)
Sodium: 140 mmol/L (ref 134–144)
eGFR: 75 mL/min/{1.73_m2} (ref >59)

## 2022-11-01 ENCOUNTER — Telehealth (HOSPITAL_COMMUNITY): Payer: Self-pay | Admitting: Emergency Medicine

## 2022-11-01 NOTE — Telephone Encounter (Signed)
Attempted to call patient regarding upcoming cardiac CT appointment. Left message on voicemail with name and callback number Marchia Bond RN Navigator Cardiac Imaging [facility] [phone] Office [phone] Cell  Arrival [address] entrance '100mg'$  metoprolol tartrate Denies iv issues  Aware contrast/nitro

## 2022-11-04 ENCOUNTER — Ambulatory Visit (HOSPITAL_COMMUNITY)
Admission: RE | Admit: 2022-11-04 | Discharge: 2022-11-04 | Disposition: A | Discharge status: Home / Self Care | Payer: Medicare HMO | Source: Ambulatory Visit | Attending: Internal Medicine | Admitting: Internal Medicine

## 2022-11-04 DIAGNOSIS — R0609 Other forms of dyspnea: Secondary | ICD-10-CM | POA: Diagnosis not present

## 2022-11-04 DIAGNOSIS — I080 Rheumatic disorders of both mitral and aortic valves: Secondary | ICD-10-CM

## 2022-11-04 DIAGNOSIS — I2510 Atherosclerotic heart disease of native coronary artery without angina pectoris: Secondary | ICD-10-CM | POA: Diagnosis not present

## 2022-11-04 MED ORDER — DILTIAZEM HCL 25 MG/5ML IV SOLN
INTRAVENOUS | Status: AC
  Administered 2022-11-04: 5 mg via INTRAVENOUS
  Filled 2022-11-04: qty 5

## 2022-11-04 MED ORDER — NITROGLYCERIN 0.4 MG SL SUBL: 0.8000 mg | SUBLINGUAL_TABLET | SUBLINGUAL | Status: DC | PRN

## 2022-11-04 MED ORDER — IOHEXOL 350 MG/ML SOLN
100.0000 mL | Freq: Once | INTRAVENOUS | Status: AC | PRN
  Administered 2022-11-04: 100 mL via INTRAVENOUS

## 2022-11-04 MED ORDER — METOPROLOL TARTRATE 5 MG/5ML IV SOLN
INTRAVENOUS | Status: AC
  Administered 2022-11-04: 5 mg via INTRAVENOUS
  Filled 2022-11-04: qty 5

## 2022-11-04 MED ORDER — NITROGLYCERIN 0.4 MG SL SUBL
SUBLINGUAL_TABLET | SUBLINGUAL | Status: AC
  Administered 2022-11-04: 0.8 mg via SUBLINGUAL
  Filled 2022-11-04: qty 2

## 2022-11-04 MED ORDER — METOPROLOL TARTRATE 5 MG/5ML IV SOLN: 5.0000 mg | INTRAVENOUS | Status: DC | PRN

## 2022-11-04 MED ORDER — DILTIAZEM HCL 25 MG/5ML IV SOLN: 5.0000 mg | Freq: Once | INTRAVENOUS | Status: AC

## 2022-11-07 ENCOUNTER — Encounter: Payer: Self-pay | Admitting: Radiology

## 2022-11-11 ENCOUNTER — Telehealth: Payer: Self-pay | Admitting: Internal Medicine

## 2022-11-11 NOTE — Telephone Encounter (Signed)
-----   Message from Chalmers Guest, MD sent at 11/05/2022 12:19 PM EST ----- Coronary calcium score is 140. There is mild coronary artery disease in the RCA vessel, nonflow limiting.  Continue current medications.

## 2022-11-11 NOTE — Telephone Encounter (Signed)
Patient informed. Copy sent to PCP °

## 2022-11-11 NOTE — Telephone Encounter (Signed)
Pt returning nurses call in regards to test results. Please advise.

## 2022-11-21 DIAGNOSIS — E1129 Type 2 diabetes mellitus with other diabetic kidney complication: Secondary | ICD-10-CM | POA: Diagnosis not present

## 2022-11-25 ENCOUNTER — Telehealth: Payer: Self-pay | Admitting: Internal Medicine

## 2022-11-25 NOTE — Telephone Encounter (Signed)
Patient informed and verbalized understanding of plan. 

## 2022-11-25 NOTE — Telephone Encounter (Signed)
Says that prior to his CT scan, he was advised that his heart had an abnormal rhythm and he was given extra medication to regulate his heart beat. Patient says he was never informed that he had an abnormal heart rhythm. Will forward to provider.

## 2022-11-25 NOTE — Telephone Encounter (Signed)
Pt c/o medication issue:  1. Name of Medication: metoprolol tartrate (LOPRESSOR) 100 MG tablet (Expired)   2. How are you currently taking this medication (dosage and times per day)?   3. Are you having a reaction (difficulty breathing--STAT)?   4. What is your medication issue? Patient states that his PCP is requesting he speak with Dr. Dellia Cloud in regards to this medication. No other reason given.

## 2022-12-26 DIAGNOSIS — D225 Melanocytic nevi of trunk: Secondary | ICD-10-CM | POA: Diagnosis not present

## 2023-01-20 ENCOUNTER — Ambulatory Visit (HOSPITAL_COMMUNITY)
Admission: RE | Admit: 2023-01-20 | Discharge: 2023-01-20 | Disposition: A | Discharge status: Home / Self Care | Payer: Medicare HMO | Source: Ambulatory Visit | Attending: Urology | Admitting: Urology

## 2023-01-20 DIAGNOSIS — N201 Calculus of ureter: Secondary | ICD-10-CM | POA: Insufficient documentation

## 2023-01-20 DIAGNOSIS — N200 Calculus of kidney: Secondary | ICD-10-CM | POA: Diagnosis not present

## 2023-01-24 ENCOUNTER — Ambulatory Visit: Payer: Medicare HMO | Admitting: Urology

## 2023-01-24 ENCOUNTER — Other Ambulatory Visit: Payer: Self-pay

## 2023-01-24 ENCOUNTER — Encounter: Payer: Self-pay | Admitting: Urology

## 2023-01-24 VITALS — BP 116/70 | HR 94

## 2023-01-24 DIAGNOSIS — N200 Calculus of kidney: Secondary | ICD-10-CM

## 2023-01-24 NOTE — Patient Instructions (Signed)

## 2023-01-24 NOTE — Progress Notes (Unsigned)
01/24/2023 11:32 AM   William Washington 1946/07/14 [id number]  Referring provider: Carylon Perches, MD [address] [place],  [place] [address]  Chief Complaint  Patient presents with   Follow-up    Kidney stones    HPI: Mr William Washington is a 77yo here for followup for nephrolithiasis. No stone events since last visit. He denies any flank pain. Reanl [place] 5/13 shows a 5mm left lower pole calculus. No significant LUTS. No other complaints today   PMH: Past Medical History:  Diagnosis Date   Diabetes mellitus without complication (HCC)    Hypercholesteremia    Hypertension    Sleep apnea     Surgical History: Past Surgical History:  Procedure Laterality Date   COLONOSCOPY WITH PROPOFOL N/A 07/07/2020   Surgeon: Lanelle Bal, DO; nonbleeding internal hemorrhoids, diverticulosis in the sigmoid and descending colon, Four 3-5 mm tubular adenomas.  Repeat in 5 years (2026).   CYSTOSCOPY WITH RETROGRADE PYELOGRAM, URETEROSCOPY AND STENT PLACEMENT Right 06/07/2021   Procedure: CYSTOSCOPY WITH RETROGRADE PYELOGRAM, URETEROSCOPY AND STENT PLACEMENT;  Surgeon: Malen Gauze, MD;  Location: [facility];  Service: Urology;  Laterality: Right;   EXTRACORPOREAL SHOCK WAVE LITHOTRIPSY Right 05/08/2021   Procedure: EXTRACORPOREAL SHOCK WAVE LITHOTRIPSY (ESWL);  Surgeon: Malen Gauze, MD;  Location: [facility];  Service: Urology;  Laterality: Right;   HOLMIUM LASER APPLICATION Right 06/07/2021   Procedure: HOLMIUM LASER APPLICATION;  Surgeon: Malen Gauze, MD;  Location: [facility];  Service: Urology;  Laterality: Right;   MENISCECTOMY Left    ORIF FEMUR FRACTURE Left    POLYPECTOMY  07/07/2020   Procedure: POLYPECTOMY;  Surgeon: Lanelle Bal, DO;  Location: [facility] END[facility] SUITE;  Service: Endoscopy;;    Home Medications:  Allergies as of 01/24/2023   No Known Allergies      Medication List        Accurate as of Jan 24, 2023 11:32 AM. If you have any questions, ask your nurse  or doctor.          Accu-Chek Guide w/Device Kit   acetaminophen 325 MG tablet Commonly known as: Tylenol Take 2 tablets (650 mg total) by mouth every 6 (six) hours as needed for up to 30 doses for mild pain or moderate pain.   aspirin EC 81 MG tablet Take 81 mg by mouth daily. Swallow whole.   atorvastatin 40 MG tablet Commonly known as: LIPITOR Take 40 mg by mouth at bedtime.   cyanocobalamin 1000 MCG tablet Commonly known as: VITAMIN B12 Take 1,000 mcg by mouth daily.   Difluprednate 0.05 % Emul Place 1 drop into the right eye daily.   Droplet Pen Needles 32G X 5 MM Misc Generic drug: Insulin Pen Needle   EQL Vitamin D3 50 MCG (2000 UT) Caps Generic drug: Cholecalciferol Take 2,000 Units by mouth daily.   hydroxypropyl methylcellulose / hypromellose 2.5 % ophthalmic solution Commonly known as: ISOPTO TEARS / GONIOVISC Place 1 drop into both eyes 3 (three) times daily as needed for dry eyes.   ibuprofen 600 MG tablet Commonly known as: ADVIL Take 1 tablet (600 mg total) by mouth every 8 (eight) hours as needed for up to 30 doses for mild pain or moderate pain.   IRON FORMULA PO Take 5 mLs by mouth as directed. Liquid iron   loratadine-pseudoephedrine 10-240 MG 24 hr tablet Commonly known as: CLARITIN-D 24-hour Take 1 tablet by mouth every other day.   pioglitazone 30 MG tablet Commonly known as: ACTOS Take 30  mg by mouth daily.   ramipril 5 MG capsule Commonly known as: ALTACE Take 5 mg by mouth daily.   sodium chloride 0.65 % Soln nasal spray Commonly known as: OCEAN Place 1 spray into both nostrils as needed for congestion.   Systane ICaps AREDS2 Caps Take 1 capsule by mouth daily.   Evaristo Bury FlexTouch 100 UNIT/ML FlexTouch Pen Generic drug: insulin degludec Inject 30 Units into the skin daily.   Turmeric 500 MG Caps Take 1,000 mg by mouth daily.        Allergies: No Known Allergies  Family History: Family History  Problem Relation Age  of Onset   Hypertension Mother    Diabetes Mother    Cancer Mother    Diabetes Father    Severe sprains Neg Hx    Scoliosis Neg Hx     Social History:  reports that he has never smoked. He has never used smokeless tobacco. He reports current alcohol use. He reports that he does not use drugs.  ROS: All other review of systems were reviewed and are negative except what is noted above in HPI  Physical Exam: BP 116/70   Pulse 94   Constitutional:  Alert and oriented, No acute distress. HEENT: [place] AT, moist mucus membranes.  Trachea midline, no masses. Cardiovascular: No clubbing, cyanosis, or edema. Respiratory: Normal respiratory effort, no increased work of breathing. GI: Abdomen is soft, nontender, nondistended, no abdominal masses GU: No CVA tenderness.  Lymph: No cervical or inguinal lymphadenopathy. Skin: No rashes, bruises or suspicious lesions. Neurologic: Grossly intact, no focal deficits, moving all 4 extremities. Psychiatric: Normal mood and affect.  Laboratory Data: Lab Results  Component Value Date   WBC 7.6 08/05/2022   HGB 8.8 (L) 08/05/2022   HCT 28.1 (L) 08/05/2022   MCV 64.0 (L) 08/05/2022   PLT 225 08/05/2022    Lab Results  Component Value Date   CREATININE 1.04 10/30/2022    No results found for: "PSA"  No results found for: "TESTOSTERONE"  Lab Results  Component Value Date   HGBA1C 6.5 (H) 06/05/2021    Urinalysis    Component Value Date/Time   COLORURINE COLORLESS (A) 08/05/2022 1003   APPEARANCEUR CLEAR 08/05/2022 1003   APPEARANCEUR Clear 01/23/2022 1505   LABSPEC 1.021 08/05/2022 1003   PHURINE 6.0 08/05/2022 1003   GLUCOSEU NEGATIVE 08/05/2022 1003   HGBUR SMALL (A) 08/05/2022 1003   BILIRUBINUR NEGATIVE 08/05/2022 1003   BILIRUBINUR Negative 01/23/2022 1505   KETONESUR NEGATIVE 08/05/2022 1003   PROTEINUR NEGATIVE 08/05/2022 1003   NITRITE NEGATIVE 08/05/2022 1003   LEUKOCYTESUR NEGATIVE 08/05/2022 1003    Lab Results   Component Value Date   LABMICR See below: 01/23/2022   WBCUA 6-10 (A) 01/23/2022   LABEPIT 0-10 01/23/2022   MUCUS Present 01/23/2022   BACTERIA NONE SEEN 08/05/2022    Pertinent Imaging: Renal US 01/20/2023: Images reviewed and discussed with the patient  Results for orders placed during the hospital encounter of 01/23/22  Abdomen 1 view (KUB)  Narrative CLINICAL DATA:  Right lower quadrant pain, nephrolithiasis.  EXAM: ABDOMEN - 1 VIEW  COMPARISON:  Abdominal x-ray 05/28/2021. Renal ultrasound 01/21/2022.  FINDINGS: There are 2 calcifications overlying the expected lower pole the left kidney measuring up to 4 mm similar to the prior study. No other suspicious calcifications are identified. Bowel-gas pattern is nonobstructive. There is a large amount of stool throughout the colon. No acute fractures are seen.  IMPRESSION: 1. Left renal calculi appear similar to the  prior study. 2. Nonobstructing bowel-gas pattern.   Electronically Signed By: Darliss Cheney M.D. On: 01/24/2022 23:08  No results found for this or any previous visit.  No results found for this or any previous visit.  No results found for this or any previous visit.  Results for orders placed during the hospital encounter of 01/20/23  Ultrasound renal complete  Narrative CLINICAL DATA:  Nephrolithiasis  EXAM: RENAL / URINARY TRACT ULTRASOUND COMPLETE  COMPARISON:  None Available.  FINDINGS: Right Kidney:  Renal measurements: 12.0 x 5.6 x 5.6 cm = volume: 196 mL. Contains a 2.1 cm cyst. No follow-up imaging recommended for the cyst.  Left Kidney:  Renal measurements: 8.8 x 4.7 x 4.0 cm = volume: 86.1 mL. Contains a 4.7 mm nonobstructive stone.  Bladder:  Appears normal for degree of bladder distention.  Other:  None.  IMPRESSION: 1. 4.7 mm nonobstructive stone in the left kidney. 2. No other abnormalities.   Electronically Signed By: Gerome Sam III M.D. On:  01/21/2023 10:41  No valid procedures specified. No results found for this or any previous visit.  No results found for this or any previous visit.   Assessment & Plan:    1, nephrolithiasis  No follow-ups on file.  Wilkie Aye, MD  [facility] Urology [place]

## 2023-01-28 ENCOUNTER — Encounter: Payer: Self-pay | Admitting: Urology

## 2023-02-05 ENCOUNTER — Ambulatory Visit (HOSPITAL_BASED_OUTPATIENT_CLINIC_OR_DEPARTMENT_OTHER): Payer: Medicare HMO

## 2023-02-05 ENCOUNTER — Ambulatory Visit (HOSPITAL_BASED_OUTPATIENT_CLINIC_OR_DEPARTMENT_OTHER): Payer: Medicare HMO | Admitting: Pulmonary Disease

## 2023-02-05 ENCOUNTER — Encounter (HOSPITAL_BASED_OUTPATIENT_CLINIC_OR_DEPARTMENT_OTHER): Payer: Self-pay | Admitting: Pulmonary Disease

## 2023-02-05 VITALS — BP 128/74 | HR 87 | Temp 98.6°F | Ht 65.0 in | Wt 207.0 lb

## 2023-02-05 DIAGNOSIS — R0609 Other forms of dyspnea: Secondary | ICD-10-CM | POA: Diagnosis not present

## 2023-02-05 DIAGNOSIS — G4733 Obstructive sleep apnea (adult) (pediatric): Secondary | ICD-10-CM | POA: Diagnosis not present

## 2023-02-05 DIAGNOSIS — R0600 Dyspnea, unspecified: Secondary | ICD-10-CM | POA: Diagnosis not present

## 2023-02-05 NOTE — Progress Notes (Signed)
Pulmonary, Critical Care, and Sleep Medicine  Chief Complaint  Patient presents with   Consult    Pt states he has complaints of SOB with exertion. Pt is also on cpap at night and denies any complaints with that.    Past Surgical History:  He  has a past surgical history that includes Meniscectomy (Left); ORIF femur fracture (Left); Colonoscopy with propofol (N/A, 07/07/2020); polypectomy (07/07/2020); Extracorporeal shock wave lithotripsy (Right, 05/08/2021); Cystoscopy with retrograde pyelogram, ureteroscopy and stent placement (Right, 06/07/2021); and Holmium laser application (Right, 06/07/2021).  Past Medical History:  DM type 2, HLD, HTN, Colon polyps, Nephrolithiasis, LBBB, Iron deficiency anemia, Thalassemia trait  Constitutional:  BP 128/74 (BP Location: Left Arm, Patient Position: Sitting, Cuff Size: Normal)   Pulse 87   Temp 98.6 F (37 C) (Oral)   Ht 5\' 5"  (1.651 m)   Wt 207 lb (93.9 kg)   SpO2 97% Comment: RA  BMI 34.45 kg/m   Brief Summary:  William Washington is a 77 y.o. male with dyspnea and obstructive sleep apnea.      Subjective:   He is from [place] originally and then lived in [place] before moving to [place].  He worked in Audiological scientist.  He briefly smoked cigarettes as a teenager, but had second hand exposure while at work for about 10 years.  No history of pneumonia, COVID, or TB.  No history of asthma or COPD, but his son has asthma.  He gets allergies year round and gets a cough from post nasal drip.  No food or medication allergies.  He has two dogs.  No history of thromboembolic disease.  He started noticing trouble with his breathing in 2020.  This has gotten progressively worse.  Now he gets short of breath after walking about 100 feet.  He also gets short of breath when he bends over to put his shoes on.  He recovers after resting for about a minute or so.  He doesn't get dizzy, feel chest pains or palpitations when this happens.   No joint or leg swelling, no skin rashes.  Reviewing his CBC between September 2021 and November 2023 shows he has persistent anemia with Hb between 8.8 and and 9.5, with a low MCV.  He was told years ago he has "Mediterranean anemia" and he previously need iron supplementation.  He was seen by hematology/oncology in 2021 and 2022.  He was seen by cardiology.  Echo and coronary CT were unremarkable for cardiac cause of dyspnea.  He did a sleep study about 12 to 15 years ago in [place] and was started on CPAP.  He got a new CPAP years ago, and has been getting supplies more recently by ordering from [employer].  He tried sleeping without CPAP when he had knee surgery, but his sleep was terrible and his oxygen level dropped.    He goes to sleep at 12 am.  He falls asleep in 30 minutes.  He wakes up 1 or 2 times to use the bathroom.  He gets out of bed at 830 am.  He feels okay in the morning when he uses CPAP.  He denies morning headache.  He does not use anything to help him fall sleep or stay awake.  He denies sleep walking, sleep talking, bruxism, or nightmares.  There is no history of restless legs.  He denies sleep hallucinations, sleep paralysis, or cataplexy.  The Epworth score is 11 out of 24.   Physical Exam:  Appearance - well kempt   ENMT - no sinus tenderness, no oral exudate, no LAN, Mallampati 3 airway, no stridor  Respiratory - equal breath sounds bilaterally, no wheezing or rales  CV - s1s2 regular rate and rhythm, no murmurs  Ext - no clubbing, no edema  Skin - no rashes  Psych - normal mood and affect   Pulmonary testing:    Chest Imaging:  Coronary CT 11/04/22 >> dependent ATX, coronary calcium score 140  Sleep Tests:    Cardiac Tests:  Echo 09/26/22 >> EF 50 to 55%, mild LVH, grade 1 DD, mild MR, mild AS  Social History:  He  reports that he has never smoked. He has never used smokeless tobacco. He reports current alcohol use. He reports that he does not  use drugs.  Family History:  His family history includes Asthma in his son; Cancer in his mother; Diabetes in his father and mother; Hypertension in his mother.     Assessment/Plan:   Dyspnea on exertion. - has been progressive since 2020 - no clear inciting event - recent cardiac assessment was unrevealing - CBC from November 2023 showed a hemoglobin of 8.8 and persistent anemia could contribute to dyspnea; will get copy of his most recent lab results  - will arrange for pulmonary function test and chest xray - additional interventions will be determined based on above test results - he likely has a component of deconditioning and would benefit from a monitored exercise program  Obstructive sleep apnea. - he has snoring, sleep disruption, apnea, and daytime sleepiness - he has history of hypertension - will arrange for a home sleep study to assess current status of his obstructive sleep apnea and then likely arrange for a new DME to get a new CPAP machine and supplies  Thalassemia trait and Iron deficiency anemia. - last seen by hematology in September 2022  Hypertension, mild valvular heart disease. - followed by Dr. Luane School with cardiology  Time Spent Involved in Patient Care on Day of Examination:  51 minutes  Follow up:   Patient Instructions  Chest xray today  Will get a copy of your most recent lab tests results from Dr. Alonza Smoker office  Will arrange for a home sleep study  Will arrange for follow up after your pulmonary function test is done  Medication List:   Allergies as of 02/05/2023   No Known Allergies      Medication List        Accurate as of Feb 05, 2023 10:22 AM. If you have any questions, ask your nurse or doctor.          STOP taking these medications    Accu-Chek Guide w/Device Kit Stopped by: Coralyn Helling, MD   acetaminophen 325 MG tablet Commonly known as: Tylenol Stopped by: Coralyn Helling, MD   aspirin EC 81 MG  tablet Stopped by: Coralyn Helling, MD   Difluprednate 0.05 % Emul Stopped by: Coralyn Helling, MD   Droplet Pen Needles 32G X 5 MM Misc Generic drug: Insulin Pen Needle Stopped by: Coralyn Helling, MD   hydroxypropyl methylcellulose / hypromellose 2.5 % ophthalmic solution Commonly known as: ISOPTO TEARS / GONIOVISC Stopped by: Coralyn Helling, MD   ibuprofen 600 MG tablet Commonly known as: ADVIL Stopped by: Coralyn Helling, MD   sodium chloride 0.65 % Soln nasal spray Commonly known as: OCEAN Stopped by: Coralyn Helling, MD   Turmeric 500 MG Caps Stopped by: Coralyn Helling, MD  TAKE these medications    atorvastatin 40 MG tablet Commonly known as: LIPITOR Take 40 mg by mouth at bedtime.   cyanocobalamin 1000 MCG tablet Commonly known as: VITAMIN B12 Take 1,000 mcg by mouth daily.   EQL Vitamin D3 50 MCG (2000 UT) Caps Generic drug: Cholecalciferol Take 2,000 Units by mouth daily.   IRON FORMULA PO Take 5 mLs by mouth as directed. Liquid iron   loratadine-pseudoephedrine 10-240 MG 24 hr tablet Commonly known as: CLARITIN-D 24-hour Take 1 tablet by mouth every other day.   pioglitazone 30 MG tablet Commonly known as: ACTOS Take 30 mg by mouth daily.   ramipril 5 MG capsule Commonly known as: ALTACE Take 5 mg by mouth daily.   Systane ICaps AREDS2 Caps Take 1 capsule by mouth daily.   Evaristo Bury FlexTouch 100 UNIT/ML FlexTouch Pen Generic drug: insulin degludec Inject 30 Units into the skin daily.        Signature:  Coralyn Helling, MD [facility] Pulmonary/Critical Care Pager - [phone] 02/05/2023, 10:22 AM

## 2023-02-05 NOTE — Patient Instructions (Signed)
Chest xray today  Will get a copy of your most recent lab tests results from Dr. Alonza Smoker office  Will arrange for a home sleep study  Will arrange for follow up after your pulmonary function test is done

## 2023-02-18 DIAGNOSIS — Z961 Presence of intraocular lens: Secondary | ICD-10-CM | POA: Diagnosis not present

## 2023-02-18 DIAGNOSIS — Z0100 Encounter for examination of eyes and vision without abnormal findings: Secondary | ICD-10-CM | POA: Diagnosis not present

## 2023-02-18 DIAGNOSIS — E113293 Type 2 diabetes mellitus with mild nonproliferative diabetic retinopathy without macular edema, bilateral: Secondary | ICD-10-CM | POA: Diagnosis not present

## 2023-02-18 DIAGNOSIS — H52223 Regular astigmatism, bilateral: Secondary | ICD-10-CM | POA: Diagnosis not present

## 2023-02-18 DIAGNOSIS — H5203 Hypermetropia, bilateral: Secondary | ICD-10-CM | POA: Diagnosis not present

## 2023-02-18 DIAGNOSIS — H524 Presbyopia: Secondary | ICD-10-CM | POA: Diagnosis not present

## 2023-02-19 DIAGNOSIS — G4730 Sleep apnea, unspecified: Secondary | ICD-10-CM | POA: Diagnosis not present

## 2023-02-21 DIAGNOSIS — E1129 Type 2 diabetes mellitus with other diabetic kidney complication: Secondary | ICD-10-CM | POA: Diagnosis not present

## 2023-02-28 DIAGNOSIS — R7309 Other abnormal glucose: Secondary | ICD-10-CM | POA: Diagnosis not present

## 2023-02-28 DIAGNOSIS — E1122 Type 2 diabetes mellitus with diabetic chronic kidney disease: Secondary | ICD-10-CM | POA: Diagnosis not present

## 2023-02-28 DIAGNOSIS — G4733 Obstructive sleep apnea (adult) (pediatric): Secondary | ICD-10-CM | POA: Diagnosis not present

## 2023-02-28 DIAGNOSIS — I10 Essential (primary) hypertension: Secondary | ICD-10-CM | POA: Diagnosis not present

## 2023-03-02 ENCOUNTER — Emergency Department (HOSPITAL_COMMUNITY): Payer: Medicare HMO

## 2023-03-02 ENCOUNTER — Encounter (HOSPITAL_COMMUNITY): Payer: Self-pay | Admitting: Emergency Medicine

## 2023-03-02 ENCOUNTER — Other Ambulatory Visit: Payer: Self-pay

## 2023-03-02 ENCOUNTER — Emergency Department (HOSPITAL_COMMUNITY)
Admission: EM | Admit: 2023-03-02 | Discharge: 2023-03-02 | Disposition: A | Discharge status: Home / Self Care | Payer: Medicare HMO | Attending: Emergency Medicine | Admitting: Emergency Medicine

## 2023-03-02 DIAGNOSIS — R600 Localized edema: Secondary | ICD-10-CM | POA: Diagnosis not present

## 2023-03-02 DIAGNOSIS — M19022 Primary osteoarthritis, left elbow: Secondary | ICD-10-CM | POA: Diagnosis not present

## 2023-03-02 DIAGNOSIS — W1830XA Fall on same level, unspecified, initial encounter: Secondary | ICD-10-CM | POA: Insufficient documentation

## 2023-03-02 DIAGNOSIS — S52042A Displaced fracture of coronoid process of left ulna, initial encounter for closed fracture: Secondary | ICD-10-CM | POA: Diagnosis not present

## 2023-03-02 DIAGNOSIS — M25422 Effusion, left elbow: Secondary | ICD-10-CM | POA: Diagnosis not present

## 2023-03-02 DIAGNOSIS — M19032 Primary osteoarthritis, left wrist: Secondary | ICD-10-CM | POA: Diagnosis not present

## 2023-03-02 DIAGNOSIS — S42402A Unspecified fracture of lower end of left humerus, initial encounter for closed fracture: Secondary | ICD-10-CM

## 2023-03-02 DIAGNOSIS — S53125A Posterior dislocation of left ulnohumeral joint, initial encounter: Secondary | ICD-10-CM | POA: Diagnosis not present

## 2023-03-02 DIAGNOSIS — S5332XA Traumatic rupture of left ulnar collateral ligament, initial encounter: Secondary | ICD-10-CM | POA: Diagnosis not present

## 2023-03-02 DIAGNOSIS — S59902A Unspecified injury of left elbow, initial encounter: Secondary | ICD-10-CM | POA: Diagnosis present

## 2023-03-02 DIAGNOSIS — S53195A Other dislocation of left ulnohumeral joint, initial encounter: Secondary | ICD-10-CM | POA: Diagnosis not present

## 2023-03-02 MED ORDER — FENTANYL CITRATE PF 50 MCG/ML IJ SOSY
PREFILLED_SYRINGE | INTRAMUSCULAR | Status: AC
  Filled 2023-03-02: qty 1

## 2023-03-02 MED ORDER — PROPOFOL 10 MG/ML IV BOLUS
0.5000 mg/kg | Freq: Once | INTRAVENOUS | Status: AC
  Administered 2023-03-02: 46.7 mg via INTRAVENOUS
  Filled 2023-03-02: qty 20

## 2023-03-02 MED ORDER — FENTANYL CITRATE PF 50 MCG/ML IJ SOSY
50.0000 ug | PREFILLED_SYRINGE | Freq: Once | INTRAMUSCULAR | Status: AC
  Administered 2023-03-02: 50 ug via INTRAVENOUS

## 2023-03-02 MED ORDER — PROPOFOL 10 MG/ML IV BOLUS
INTRAVENOUS | Status: AC | PRN
  Administered 2023-03-02: 45 mg via INTRAVENOUS

## 2023-03-02 MED ORDER — HYDROCODONE-ACETAMINOPHEN 5-325 MG PO TABS: 1.0000 | ORAL_TABLET | Freq: Four times a day (QID) | ORAL | 0 refills | Status: AC | PRN

## 2023-03-02 MED ORDER — MORPHINE SULFATE (PF) 4 MG/ML IV SOLN
4.0000 mg | Freq: Once | INTRAVENOUS | Status: AC
  Administered 2023-03-02: 4 mg via INTRAVENOUS
  Filled 2023-03-02: qty 1

## 2023-03-02 NOTE — ED Notes (Signed)
Pt updated with plan. Called MRI to request a heads up on an ETA to allow for adequate pain control prior to MRI session

## 2023-03-02 NOTE — ED Triage Notes (Signed)
Pt reports falling yesterday and trying to catch himself with his left arm. Pt denies LOC. Pt reports left elbow pain.

## 2023-03-02 NOTE — ED Notes (Signed)
Patient and spouse verbalized understanding and voiced no further questions. Pt was wheeled to parking lot and placed in the backseat of the spouse car. Pt was alert and oriented x4 at baseline. Gait was even and equal at baseline.

## 2023-03-02 NOTE — Discharge Instructions (Addendum)
As discussed, with your fracture/dislocation of your elbow has been important that you follow-up with her orthopedic colleagues.  If you do not receive a call tomorrow for scheduling, please call. Return here for concerning changes in your condition.

## 2023-03-02 NOTE — ED Notes (Signed)
Splint applied. Waiting on provider to assess. PT to dc following.

## 2023-03-02 NOTE — ED Provider Notes (Signed)
EMERGENCY DEPARTMENT AT [facility] Provider Note   CSN: [id number] Arrival date & time: 03/02/23  1028     History  Chief Complaint  Patient presents with   Arm Pain    William Washington is a 77 y.o. male.  HPI Patient presents with left elbow pain.  Patient had a mechanical fall yesterday, landing on his left elbow.  Since that time he has been unable to move the elbow secondary to pain.  He has no shoulder or wrist pain, though with attempts at moving these joints he does have some elbow pain.  No head trauma, no loss of consciousness, no other complaints. Unclear why the patient waited 1 day for evaluation.    Home Medications Prior to Admission medications   Medication Sig Start Date End Date Taking? Authorizing Provider  HYDROcodone-acetaminophen (NORCO/VICODIN) 5-325 MG tablet Take 1 tablet by mouth every 6 (six) hours as needed for severe pain. 03/02/23  Yes Gerhard Munch, MD  atorvastatin (LIPITOR) 40 MG tablet Take 40 mg by mouth at bedtime.     [provider]  Cholecalciferol (EQL VITAMIN D3) 50 MCG (2000 UT) CAPS Take 2,000 Units by mouth daily.    [provider]  Iron-Folic Acid-Vit B12 (IRON FORMULA PO) Take 5 mLs by mouth as directed. Liquid iron    [provider]  loratadine-pseudoephedrine (CLARITIN-D 24-HOUR) 10-240 MG 24 hr tablet Take 1 tablet by mouth every other day.     [provider]  Multiple Vitamins-Minerals (SYSTANE ICAPS AREDS2) CAPS Take 1 capsule by mouth daily.    [provider]  pioglitazone (ACTOS) 30 MG tablet Take 30 mg by mouth daily.    [provider]  ramipril (ALTACE) 5 MG capsule Take 5 mg by mouth daily.    [provider]  TRESIBA FLEXTOUCH 100 UNIT/ML FlexTouch Pen Inject 30 Units into the skin daily. 07/12/20   [provider]  vitamin B-12 (CYANOCOBALAMIN) 1000 MCG tablet Take 1,000 mcg by mouth daily.    [provider]       Allergies    Patient has no known allergies.    Review of Systems   Review of Systems  All other systems reviewed and are negative.   Physical Exam Updated Vital Signs BP (!) 147/74   Pulse 82   Temp 98 F (36.7 C) (Oral)   Resp 18   Ht 5\' 5"  (1.651 m)   Wt 93.4 kg   SpO2 100%   BMI 34.28 kg/m  Physical Exam Vitals and nursing note reviewed.  Constitutional:      General: He is not in acute distress.    Appearance: He is well-developed.  HENT:     Head: Normocephalic and atraumatic.  Eyes:     Conjunctiva/sclera: Conjunctivae normal.  Cardiovascular:     Rate and Rhythm: Normal rate and regular rhythm.  Pulmonary:     Effort: Pulmonary effort is normal. No respiratory distress.     Breath sounds: No stridor.  Abdominal:     General: There is no distension.  Musculoskeletal:     Left shoulder: Normal.     Left elbow: Swelling and deformity present. Decreased range of motion. Tenderness present.     Left wrist: Normal.  Skin:    General: Skin is warm and dry.  Neurological:     Mental Status: He is alert and oriented to person, place, and time.     ED Results / Procedures / Treatments  Labs (all labs ordered are listed, but only abnormal results are displayed) Labs Reviewed - No data to display  EKG None  Radiology DG Elbow 2 Views Left  Result Date: 03/02/2023 CLINICAL DATA:  Left elbow dislocation status post reduction EXAM: LEFT ELBOW - 2 VIEW COMPARISON:  Same day radiographs FINDINGS: Two limited radiographs of the left elbow were taken with patient in a sling. There appears to be successful reduction of the elbow based on the lateral radiograph. Mildly displaced fracture through the coronoid process of the proximal ulna. No definite additional fractures are evident on these limited views. Large elbow joint effusion. Diffuse soft tissue swelling. IMPRESSION: 1. Limited study. 2. Successful reduction of the elbow. 3. Mildly displaced fracture through  the coronoid process of the proximal ulna. Electronically Signed   By: Duanne Guess D.O.   On: 03/02/2023 14:46   DG Forearm Left  Result Date: 03/02/2023 CLINICAL DATA:  Fall, left elbow pain EXAM: LEFT ELBOW - COMPLETE 3+ VIEW; LEFT FOREARM - 2 VIEW; LEFT HUMERUS - 2+ VIEW COMPARISON:  None Available. FINDINGS: Posterior dislocation of the left elbow joint. The coronoid process of the proximal ulna appears fractured. No additional fracture is identified. Shoulder joint and wrist joint alignment is maintained. Mild-to-moderate degenerative changes. Prominent soft tissue swelling at the elbow. IMPRESSION: Posterior dislocation of the left elbow joint. The coronoid process of the proximal ulna appears fractured. Attention on postreduction films. Electronically Signed   By: Duanne Guess D.O.   On: 03/02/2023 12:08   DG Humerus Left  Result Date: 03/02/2023 CLINICAL DATA:  Fall, left elbow pain EXAM: LEFT ELBOW - COMPLETE 3+ VIEW; LEFT FOREARM - 2 VIEW; LEFT HUMERUS - 2+ VIEW COMPARISON:  None Available. FINDINGS: Posterior dislocation of the left elbow joint. The coronoid process of the proximal ulna appears fractured. No additional fracture is identified. Shoulder joint and wrist joint alignment is maintained. Mild-to-moderate degenerative changes. Prominent soft tissue swelling at the elbow. IMPRESSION: Posterior dislocation of the left elbow joint. The coronoid process of the proximal ulna appears fractured. Attention on postreduction films. Electronically Signed   By: Duanne Guess D.O.   On: 03/02/2023 12:08   DG Elbow Complete Left  Result Date: 03/02/2023 CLINICAL DATA:  Fall, left elbow pain EXAM: LEFT ELBOW - COMPLETE 3+ VIEW; LEFT FOREARM - 2 VIEW; LEFT HUMERUS - 2+ VIEW COMPARISON:  None Available. FINDINGS: Posterior dislocation of the left elbow joint. The coronoid process of the proximal ulna appears fractured. No additional fracture is identified. Shoulder joint and wrist joint  alignment is maintained. Mild-to-moderate degenerative changes. Prominent soft tissue swelling at the elbow. IMPRESSION: Posterior dislocation of the left elbow joint. The coronoid process of the proximal ulna appears fractured. Attention on postreduction films. Electronically Signed   By: Duanne Guess D.O.   On: 03/02/2023 12:08    Procedures .Ortho Injury Treatment  Date/Time: 03/02/2023 2:30 PM  Performed by: Gerhard Munch, MD Authorized by: Gerhard Munch, MD   Consent:    Consent obtained:  Written   Consent given by:  Patient   Risks discussed:  Vascular damage, restricted joint movement, stiffness, recurrent dislocation and irreducible dislocation   Alternatives discussed:  Alternative treatment Universal protocol:    Procedure explained and questions answered to patient or proxy's satisfaction: yes     Relevant documents present and verified: yes     Test results available and properly labeled: yes     Imaging studies available: yes     Required blood  products, implants, devices, and special equipment available: yes     Site/side marked: yes     Immediately prior to procedure a time out was called: yes     Patient identity confirmed:  Verbally with patientInjury location: elbow Location details: left elbow Injury type: fracture-dislocation Fracture-dislocation type: [place] Pre-procedure neurovascular assessment: neurovascularly intact Pre-procedure distal perfusion: normal Pre-procedure neurological function: normal Pre-procedure range of motion: reduced  Anesthesia: Local anesthesia used: no  Patient sedated: Yes. Refer to sedation procedure documentation for details of sedation. Immobilization: splint Splint type: long arm Splint Applied by: ED Nurse Supplies used: Ortho-Glass Post-procedure neurovascular assessment: post-procedure neurovascularly intact Post-procedure distal perfusion: normal Post-procedure neurological function: normal Post-procedure  range of motion: unchanged   .Sedation  Date/Time: 03/02/2023 2:30 PM  Performed by: Gerhard Munch, MD Authorized by: Gerhard Munch, MD   Consent:    Consent obtained:  Written   Consent given by:  Patient   Risks discussed:  Allergic reaction, inadequate sedation, nausea, vomiting and respiratory compromise necessitating ventilatory assistance and intubation   Alternatives discussed:  Anxiolysis and analgesia without sedation Universal protocol:    Procedure explained and questions answered to patient or proxy's satisfaction: yes     Relevant documents present and verified: yes     Test results available: yes     Imaging studies available: yes     Required blood products, implants, devices, and special equipment available: yes     Site/side marked: yes     Immediately prior to procedure, a time out was called: yes     Patient identity confirmed:  Verbally with patient Indications:    Procedure performed:  Dislocation reduction   Procedure necessitating sedation performed by:  Physician performing sedation Pre-sedation assessment:    Time since last food or drink:  4   ASA classification: class 2 - patient with mild systemic disease     Mouth opening:  2 finger widths   Thyromental distance:  2 finger widths   Mallampati score:  II - soft palate, uvula, fauces visible   Neck mobility: reduced     Pre-sedation assessments completed and reviewed: airway patency, cardiovascular function, mental status, nausea/vomiting and pain level     Pre-sedation assessment completed:  03/02/2023 2:00 PM Immediate pre-procedure details:    Reassessment: Patient reassessed immediately prior to procedure     Reviewed: vital signs     Verified: bag valve mask available, emergency equipment available, intubation equipment available, IV patency confirmed and oxygen available   Procedure details (see MAR for exact dosages):    Preoxygenation:  Nasal cannula   Sedation:  Propofol   Intended  level of sedation: deep   Analgesia:  Morphine   Intra-procedure monitoring:  Blood pressure monitoring, cardiac monitor, continuous capnometry, continuous pulse oximetry, frequent LOC assessments and frequent vital sign checks   Intra-procedure events: none     Total Provider sedation time (minutes):  20 Post-procedure details:    Post-sedation assessment completed:  03/02/2023 3:59 PM   Attendance: Constant attendance by certified staff until patient recovered     Recovery: Patient returned to pre-procedure baseline     Post-sedation assessments completed and reviewed: airway patency, cardiovascular function, mental status, nausea/vomiting and pain level     Patient is stable for discharge or admission: yes     Procedure completion:  Tolerated well, no immediate complications     Medications Ordered in ED Medications  morphine (PF) 4 MG/ML injection 4 mg (has no administration in time range)  propofol (DIPRIVAN) 10 mg/mL bolus/IV push 46.7 mg (46.7 mg Intravenous Given 03/02/23 1302)  propofol (DIPRIVAN) 10 mg/mL bolus/IV push (45 mg Intravenous Given 03/02/23 1305)    ED Course/ Medical Decision Making/ A&P                             Medical Decision Making Adult male presents after mechanical fall with left elbow pain, unwillingness to move the elbow secondary to pain/swelling/deformity.  He is distally neurovascular intact.  Concern for fracture versus soft tissue injury.  X-ray ordered.   Amount and/or Complexity of Data Reviewed External Data Reviewed: notes. Radiology: ordered and independent interpretation performed. Decision-making details documented in ED Course.  Risk Prescription drug management. Decision regarding hospitalization.  Update: I discussed the patient's case with Dr. Susa Simmonds, our orthopedic colleague.  With concern for fracture dislocation patient will have MRI, CT not currently available, to characterize fracture, and will follow-up in the office.  This  will be facilitated by the Ortho team. Patient tolerated conscious sedation, reduction of his fracture dislocation of his elbow without complication, though notably, during reduction, instability of the elbow itself was easily appreciated. Patient awake, alert, aware of all findings, as is his wife, and friend.  After MRI patient will be discharged to follow-up with orthopedics, tomorrow.  Final Clinical Impression(s) / ED Diagnoses Final diagnoses:  Closed fracture dislocation of left elbow, initial encounter    Rx / DC Orders ED Discharge Orders          Ordered    HYDROcodone-acetaminophen (NORCO/VICODIN) 5-325 MG tablet  Every 6 hours PRN        03/02/23 1553              Gerhard Munch, MD 03/02/23 1601

## 2023-03-02 NOTE — ED Provider Notes (Signed)
Patient having significant pain during splint application.  Was given 50 mcg of fentanyl.  Splint was applied.  Patient neurovascularly intact afterwards.  Discharged home with pain medication and instructions to follow-up with orthopedics.  Gaylord Shih Injury Treatment  Date/Time: 03/02/2023 6:05 PM  Performed by: Rondel Baton, MD Authorized by: Rondel Baton, MD  Injury location: elbow Location details: left elbow Injury type: fracture-dislocation Pre-procedure distal perfusion: normal Pre-procedure neurological function: normal Pre-procedure range of motion: reduced Immobilization: sling and splint Splint type: long arm Splint Applied by: ED Tech Supplies used: cotton padding and Ortho-Glass Post-procedure neurovascular assessment: post-procedure neurovascularly intact       Rondel Baton, MD 03/02/23 [phone]

## 2023-03-04 DIAGNOSIS — M25522 Pain in left elbow: Secondary | ICD-10-CM | POA: Diagnosis not present

## 2023-03-05 ENCOUNTER — Other Ambulatory Visit: Payer: Self-pay | Admitting: Orthopedic Surgery

## 2023-03-05 ENCOUNTER — Encounter: Payer: Self-pay | Admitting: Orthopedic Surgery

## 2023-03-05 DIAGNOSIS — M25522 Pain in left elbow: Secondary | ICD-10-CM

## 2023-03-06 ENCOUNTER — Ambulatory Visit
Admission: RE | Admit: 2023-03-06 | Discharge: 2023-03-06 | Disposition: A | Discharge status: Home / Self Care | Payer: Medicare HMO | Source: Ambulatory Visit | Attending: Orthopedic Surgery | Admitting: Orthopedic Surgery

## 2023-03-06 DIAGNOSIS — S52042A Displaced fracture of coronoid process of left ulna, initial encounter for closed fracture: Secondary | ICD-10-CM | POA: Diagnosis not present

## 2023-03-06 DIAGNOSIS — M25522 Pain in left elbow: Secondary | ICD-10-CM

## 2023-03-06 DIAGNOSIS — M25422 Effusion, left elbow: Secondary | ICD-10-CM | POA: Diagnosis not present

## 2023-03-07 ENCOUNTER — Other Ambulatory Visit: Payer: Medicare HMO

## 2023-03-07 ENCOUNTER — Telehealth: Payer: Self-pay

## 2023-03-07 NOTE — Telephone Encounter (Signed)
Transition Care Management Unsuccessful Follow-up Telephone Call  Date of discharge and from where:  [facility]  Attempts:  1st Attempt  Reason for unsuccessful TCM follow-up call:  No answer/busy   Lenard Forth [facility] Guide, [facility] [phone] [address] [place], [place], [place] [address] Phone: [phone] Email: [email or web address]

## 2023-03-10 ENCOUNTER — Telehealth: Payer: Self-pay | Admitting: *Deleted

## 2023-03-10 ENCOUNTER — Telehealth: Payer: Self-pay | Admitting: Pulmonary Disease

## 2023-03-10 ENCOUNTER — Telehealth: Payer: Self-pay

## 2023-03-10 DIAGNOSIS — G4733 Obstructive sleep apnea (adult) (pediatric): Secondary | ICD-10-CM

## 2023-03-10 DIAGNOSIS — M25522 Pain in left elbow: Secondary | ICD-10-CM | POA: Diagnosis not present

## 2023-03-10 NOTE — Telephone Encounter (Signed)
HST 02/19/23 >> AHI 60, SpO2 low 79%   Please let him know his sleep study shows severe sleep apnea.  I have put in an order to get him a new auto CPAP machine.

## 2023-03-10 NOTE — Progress Notes (Signed)
  Care Coordination   Note   03/10/2023 Name: [name] MRN: [id number] DOB: April 22, 1946  William Washington is a 77 y.o. year old male who sees Carylon Perches, MD for primary care. I reached out to [name] by phone today to offer care coordination services.  [name] was given information about Care Coordination services today including:   The Care Coordination services include support from the care team which includes your Nurse Coordinator, Clinical Social Worker, or Pharmacist.  The Care Coordination team is here to help remove barriers to the health concerns and goals most important to you. Care Coordination services are voluntary, and the patient may decline or stop services at any time by request to their care team member.   Care Coordination Consent Status: Patient did not agree to participate in care coordination services at this time.   Encounter Outcome:  Pt. Refused [facility] Coordination Care Guide  Direct Dial: [phone]

## 2023-03-10 NOTE — Telephone Encounter (Signed)
Transition Care Management Follow-up Telephone Call Date of discharge and from where: [facility] 6/23 How have you been since you were released from the hospital? D Any questions or concerns?  Doing ok going into surgery todayNo  Items Reviewed: Did the pt receive and understand the discharge instructions provided? Yes  Medications obtained and verified? Yes  Other? No  Any new allergies since your discharge? No  Dietary orders reviewed? No Do you have support at home? Yes    Follow up appointments reviewed:  PCP Hospital f/u appt confirmed? Yes  Scheduled to see  on  @ . Specialist Hospital f/u appt confirmed? Yes  Scheduled to see  on  @ . Are transportation arrangements needed? No  If their condition worsens, is the pt aware to call PCP or go to the Emergency Dept.? Yes Was the patient provided with contact information for the PCP's office or ED? Yes Was to pt encouraged to call back with questions or concerns? Yes

## 2023-03-12 DIAGNOSIS — M25522 Pain in left elbow: Secondary | ICD-10-CM | POA: Diagnosis not present

## 2023-03-12 NOTE — Telephone Encounter (Signed)
Called and spoke w/ pt he verbalized understanding. NFN att. 

## 2023-03-26 DIAGNOSIS — M25522 Pain in left elbow: Secondary | ICD-10-CM | POA: Diagnosis not present

## 2023-04-07 ENCOUNTER — Ambulatory Visit: Payer: Medicare HMO | Admitting: Internal Medicine

## 2023-04-16 DIAGNOSIS — M25522 Pain in left elbow: Secondary | ICD-10-CM | POA: Diagnosis not present

## 2023-05-05 ENCOUNTER — Encounter: Payer: Self-pay | Admitting: Internal Medicine

## 2023-05-05 ENCOUNTER — Ambulatory Visit: Payer: Medicare HMO | Attending: Internal Medicine | Admitting: Internal Medicine

## 2023-05-05 VITALS — BP 130/82 | HR 104 | Ht 65.0 in | Wt 195.6 lb

## 2023-05-05 DIAGNOSIS — I080 Rheumatic disorders of both mitral and aortic valves: Secondary | ICD-10-CM

## 2023-05-05 NOTE — Patient Instructions (Addendum)
Medication Instructions:  Your physician recommends that you continue on your current medications as directed. Please refer to the Current Medication list given to you today.   Labwork: None  Testing/Procedures: None  Follow-Up: Your physician recommends that you schedule a follow-up appointment in: 2 years. You will receive a reminder call in about 18 months reminding you to schedule your appointment. If you don't receive this call, please contact our office.   Any Other Special Instructions Will Be Listed Below (If Applicable).  If you need a refill on your cardiac medications before your next appointment, please call your pharmacy.

## 2023-05-05 NOTE — Progress Notes (Signed)
Cardiology Office Note  Date: 05/05/2023   ID: William Washington, DOB 25-Jan-1946, MRN [id number]  PCP:  Carylon Perches, MD  Cardiologist:  Marjo Bicker, MD Electrophysiologist:  None   History of Present Illness: William Washington is a 77 y.o. male known to have HTN, DM 2, HLD, OSA on CPAP is here for SOB evaluation.  Echocardiogram in 1/24 showed normal LVEF, G1 DD, mild aortic valve stenosis.  CT cardiac in 2/24 showed coronary calcium score 140 and mild CAD in the RCA.  Patient is here for follow-up visit.  Overall doing great.  SOB improved after losing weight and getting a new CPAP machine.  No angina, dizziness, presyncope, syncope, leg swelling, orthopnea, PND or DOE.  He said he lost weight after his PCP put him on a new medication.  His heart rate is elevated today in the clinic.  Drinks a big cup of coffee every day.  Past Medical History:  Diagnosis Date   Colon polyp    Diabetes mellitus without complication (HCC)    Hypercholesteremia    Hypertension    Iron deficiency anemia    LBBB (left bundle branch block)    Nephrolithiasis    Sleep apnea    Thalassemia trait     Past Surgical History:  Procedure Laterality Date   COLONOSCOPY WITH PROPOFOL N/A 07/07/2020   Surgeon: Lanelle Bal, DO; nonbleeding internal hemorrhoids, diverticulosis in the sigmoid and descending colon, Four 3-5 mm tubular adenomas.  Repeat in 5 years (2026).   CYSTOSCOPY WITH RETROGRADE PYELOGRAM, URETEROSCOPY AND STENT PLACEMENT Right 06/07/2021   Procedure: CYSTOSCOPY WITH RETROGRADE PYELOGRAM, URETEROSCOPY AND STENT PLACEMENT;  Surgeon: Malen Gauze, MD;  Location: [facility];  Service: Urology;  Laterality: Right;   EXTRACORPOREAL SHOCK WAVE LITHOTRIPSY Right 05/08/2021   Procedure: EXTRACORPOREAL SHOCK WAVE LITHOTRIPSY (ESWL);  Surgeon: Malen Gauze, MD;  Location: [facility];  Service: Urology;  Laterality: Right;   HOLMIUM LASER APPLICATION Right 06/07/2021   Procedure:  HOLMIUM LASER APPLICATION;  Surgeon: Malen Gauze, MD;  Location: [facility];  Service: Urology;  Laterality: Right;   MENISCECTOMY Left    ORIF FEMUR FRACTURE Left    POLYPECTOMY  07/07/2020   Procedure: POLYPECTOMY;  Surgeon: Lanelle Bal, DO;  Location: [facility] SU[facility];  Service: Endoscopy;;    Current Outpatient Medications  Medication Sig Dispense Refill   atorvastatin (LIPITOR) 40 MG tablet Take 40 mg by mouth at bedtime.      Cholecalciferol (EQL VITAMIN D3) 50 MCG (2000 UT) CAPS Take 2,000 Units by mouth daily.     HYDROcodone-acetaminophen (NORCO/VICODIN) 5-325 MG tablet Take 1 tablet by mouth every 6 (six) hours as needed for severe pain. 12 tablet 0   Iron-Folic Acid-Vit B12 (IRON FORMULA PO) Take 5 mLs by mouth as directed. Liquid iron     loratadine-pseudoephedrine (CLARITIN-D 24-HOUR) 10-240 MG 24 hr tablet Take 1 tablet by mouth every other day.      pioglitazone (ACTOS) 30 MG tablet Take 30 mg by mouth daily.     ramipril (ALTACE) 5 MG capsule Take 5 mg by mouth daily.     TRESIBA FLEXTOUCH 100 UNIT/ML FlexTouch Pen Inject 30 Units into the skin daily.     vitamin B-12 (CYANOCOBALAMIN) 1000 MCG tablet Take 1,000 mcg by mouth daily.     No current facility-administered medications for this visit.   Allergies:  Patient has no known allergies.   Social History: The patient  reports that he has never  smoked. He has never used smokeless tobacco. He reports current alcohol use. He reports that he does not use drugs.   Family History: The patient's family history includes Asthma in his son; Cancer in his mother; Diabetes in his father and mother; Hypertension in his mother.   ROS:  Please see the history of present illness. Otherwise, complete review of systems is positive for none.  All other systems are reviewed and negative.   Physical Exam: VS:  BP 130/82   Pulse (!) 104   Ht 5\' 5"  (1.651 m)   Wt 195 lb 9.6 oz (88.7 kg)   SpO2 97%   BMI 32.55 kg/m , BMI Body  mass index is 32.55 kg/m.  Wt Readings from Last 3 Encounters:  05/05/23 195 lb 9.6 oz (88.7 kg)  03/02/23 206 lb (93.4 kg)  02/05/23 207 lb (93.9 kg)    General: Patient appears comfortable at rest. HEENT: Conjunctiva and lids normal, oropharynx clear with moist mucosa. Neck: Supple, no elevated JVP or carotid bruits, no thyromegaly. Lungs: Clear to auscultation, nonlabored breathing at rest. Cardiac: Regular rate and rhythm, no S3 or significant systolic murmur, no pericardial rub. Abdomen: Soft, nontender, no hepatomegaly, bowel sounds present, no guarding or rebound. Extremities: No pitting edema, distal pulses 2+. Skin: Warm and dry. Musculoskeletal: No kyphosis. Neuropsychiatric: Alert and oriented x3, affect grossly appropriate.  ECG:  An ECG dated 10/24/2022 was personally reviewed today and demonstrated:  Normal sinus rhythm, LBBB  Recent Labwork: 08/05/2022: ALT 19; AST 19; Hemoglobin 8.8; Platelets 225 10/30/2022: BUN 17; Creatinine, Ser 1.04; Potassium 4.0; Sodium 140  No results found for: "CHOL", "TRIG", "HDL", "CHOLHDL", "VLDL", "LDLCALC", "LDLDIRECT"  Other Studies Reviewed Today: Echocardiogram from 09/2022 LVEF 50 to 55% G1 DD Mild MR Mild aortic stenosis  Assessment and Plan: Patient is a 77 year old M known to have HTN, DM 2, HLD, OSA on CPAP was referred to cardiology clinic for evaluation of SOB.  SOB, improved: After losing weight and getting a new CPAP machine.  No SOB with exertion now. Cannot climb stairs due to new issues.  CT cardiac in 2/24 showed coronary calcium score of 140 and mild CAD in the RCA.  Mild aortic valve stenosis in 1/24: Repeat echocardiogram in 3 years.  Elevated heart rate: HR today is 104 bpm.  Drinks 1 big cup of coffee every day.  Instructed him to check his HR at home as he said he is nervous in the clinic today.  If his HR is more than 90 bpm at home, will need to cut down on the coffee.  He verbalized understanding.  HTN,  controlled: Continue ramipril 5 mg once daily, follows with PCP.  OSA on CPAP: Continue CPAP machine after which his SOB improved.     Disposition:  Follow up  2 years  Signed Jeanita Carneiro Verne Spurr, MD, 05/05/2023 2:02 PM    [facility] at [facility] [address] [place], [place], [place] [address]

## 2023-05-09 ENCOUNTER — Ambulatory Visit (HOSPITAL_BASED_OUTPATIENT_CLINIC_OR_DEPARTMENT_OTHER): Payer: Medicare HMO | Admitting: Pulmonary Disease

## 2023-05-09 ENCOUNTER — Encounter (HOSPITAL_BASED_OUTPATIENT_CLINIC_OR_DEPARTMENT_OTHER): Payer: Medicare HMO

## 2023-05-21 ENCOUNTER — Ambulatory Visit (INDEPENDENT_AMBULATORY_CARE_PROVIDER_SITE_OTHER): Payer: Medicare HMO | Admitting: Pulmonary Disease

## 2023-05-21 DIAGNOSIS — R0609 Other forms of dyspnea: Secondary | ICD-10-CM

## 2023-05-21 LAB — PULMONARY FUNCTION TEST
DL/VA % pred: 82 %
DL/VA: 3.39 ml/min/mmHg/L
DLCO cor % pred: 68 %
DLCO cor: 13.74 ml/min/mmHg
DLCO unc % pred: 68 %
DLCO unc: 13.74 ml/min/mmHg
FEF 25-75 Post: 2.18 L/s
FEF 25-75 Pre: 1.83 L/s
FEF2575-%Change-Post: 19 %
FEF2575-%Pred-Post: 135 %
FEF2575-%Pred-Pre: 113 %
FEV1-%Change-Post: 3 %
FEV1-%Pred-Post: 87 %
FEV1-%Pred-Pre: 83 %
FEV1-Post: 1.93 L
FEV1-Pre: 1.86 L
FEV1FVC-%Change-Post: 0 %
FEV1FVC-%Pred-Pre: 112 %
FEV6-%Change-Post: 3 %
FEV6-%Pred-Post: 82 %
FEV6-%Pred-Pre: 79 %
FEV6-Post: 2.37 L
FEV6-Pre: 2.29 L
FEV6FVC-%Change-Post: 0 %
FEV6FVC-%Pred-Post: 107 %
FEV6FVC-%Pred-Pre: 108 %
FVC-%Change-Post: 3 %
FVC-%Pred-Post: 76 %
FVC-%Pred-Pre: 73 %
FVC-Post: 2.37 L
FVC-Pre: 2.29 L
Post FEV1/FVC ratio: 81 %
Post FEV6/FVC ratio: 100 %
Pre FEV1/FVC ratio: 82 %
Pre FEV6/FVC Ratio: 100 %
RV % pred: 104 %
RV: 2.28 L
TLC % pred: 80 %
TLC: 4.54 L

## 2023-05-21 NOTE — Patient Instructions (Signed)
Full PFT Performed Today  

## 2023-05-21 NOTE — Progress Notes (Signed)
Full PFT Performed Today  

## 2023-05-28 DIAGNOSIS — M25522 Pain in left elbow: Secondary | ICD-10-CM | POA: Diagnosis not present

## 2023-05-30 ENCOUNTER — Encounter (HOSPITAL_BASED_OUTPATIENT_CLINIC_OR_DEPARTMENT_OTHER): Payer: Self-pay | Admitting: Pulmonary Disease

## 2023-05-30 ENCOUNTER — Ambulatory Visit (HOSPITAL_BASED_OUTPATIENT_CLINIC_OR_DEPARTMENT_OTHER): Payer: Medicare HMO | Admitting: Pulmonary Disease

## 2023-05-30 VITALS — BP 132/78 | HR 94 | Resp 16 | Ht 63.0 in | Wt 188.8 lb

## 2023-05-30 DIAGNOSIS — R942 Abnormal results of pulmonary function studies: Secondary | ICD-10-CM

## 2023-05-30 DIAGNOSIS — G4733 Obstructive sleep apnea (adult) (pediatric): Secondary | ICD-10-CM | POA: Diagnosis not present

## 2023-05-30 DIAGNOSIS — Z2821 Immunization not carried out because of patient refusal: Secondary | ICD-10-CM

## 2023-05-30 NOTE — Progress Notes (Signed)
[facility]  Chief Complaint  Patient presents with   Follow-up    Past Surgical History:  He  has a past surgical history that includes Meniscectomy (Left); ORIF femur fracture (Left); Colonoscopy with propofol (N/A, 07/07/2020); polypectomy (07/07/2020); Extracorporeal shock wave lithotripsy (Right, 05/08/2021); Cystoscopy with retrograde pyelogram, ureteroscopy and stent placement (Right, 06/07/2021); and Holmium laser application (Right, 06/07/2021).  Past Medical History:  DM type 2, HLD, HTN, Colon polyps, Nephrolithiasis, LBBB, Iron deficiency anemia, Thalassemia trait  Constitutional:  BP 132/78   Pulse 94   Resp 16   Ht 5\' 3"  (1.6 m)   Wt 188 lb 12.8 oz (85.6 kg)   SpO2 92%   BMI 33.44 kg/m   Brief Summary:  William Washington is a 77 y.o. male with dyspnea and obstructive sleep apnea.      Subjective:   PFT showed isolated diffusion defect.  Home sleep study showed severe sleep apnea.  Chest xray was clear.  He is feeling much better since starting CPAP.  No issues with mask fit or pressure setting.  He feels his breathing is better also.  He doesn't take the flu vaccine.   Physical Exam:   Appearance - well kempt   ENMT - no sinus tenderness, no oral exudate, no LAN, Mallampati 3 airway, no stridor  Respiratory - equal breath sounds bilaterally, no wheezing or rales  CV - s1s2 regular rate and rhythm, no murmurs  Ext - no clubbing, no edema  Skin - no rashes  Psych - normal mood and affect   Pulmonary testing:  PFT 05/21/23 >> FEV1 1.93 (87%), FEV1% 81, TLC 4.54 (80%), DLCO 68%  Chest Imaging:  Coronary CT 11/04/22 >> dependent ATX, coronary calcium score 140  Sleep Tests:  HST 02/19/23 >> AHI 60, SpO2 low 79%  Auto CPAP 04/30/23 to 05/29/23 >> used on 30 of 30 nights with average 7 hrs 57 min.  Average AHI 1.1 with median CPAP 7 and 95 th percentile CPAP 10 cm H2O  Cardiac Tests:  Echo 09/26/22 >> EF  50 to 55%, mild LVH, grade 1 DD, mild MR, mild AS  Social History:  He  reports that he has never smoked. He has never used smokeless tobacco. He reports current alcohol use. He reports that he does not use drugs.  Family History:  His family history includes Asthma in his son; Cancer in his mother; Diabetes in his father and mother; Hypertension in his mother.     Assessment/Plan:   Dyspnea on exertion. - likely related to anemia and deconditioning - improved  Obstructive sleep apnea. - he is compliant with CPAP and reports benefit from therapy - he uses [employer] for his DME - current CPAP ordered July 2024 - continue auto CPAP 5 to 20 cm H2O  Thalassemia trait and Iron deficiency anemia. - last seen by hematology in September 2022 - isolated diffusion defect on PFT likely related to anemia  Hypertension, mild valvular heart disease. - followed by Dr. Luane School with cardiology  Time Spent Involved in Patient Care on Day of Examination:  28 minutes  Follow up:   Patient Instructions  Follow up in 1 year  Medication List:   Allergies as of 05/30/2023   No Known Allergies      Medication List        Accurate as of May 30, 2023 12:05 PM. If you have any questions, ask your nurse or doctor.  atorvastatin 40 MG tablet Commonly known as: LIPITOR Take 40 mg by mouth at bedtime.   cyanocobalamin 1000 MCG tablet Commonly known as: VITAMIN B12 Take 1,000 mcg by mouth daily.   EQL Vitamin D3 50 MCG (2000 UT) Caps Generic drug: Cholecalciferol Take 2,000 Units by mouth daily.   HYDROcodone-acetaminophen 5-325 MG tablet Commonly known as: NORCO/VICODIN Take 1 tablet by mouth every 6 (six) hours as needed for severe pain.   IRON FORMULA PO Take 5 mLs by mouth as directed. Liquid iron   loratadine-pseudoephedrine 10-240 MG 24 hr tablet Commonly known as: CLARITIN-D 24-hour Take 1 tablet by mouth every other day.   pioglitazone 30 MG  tablet Commonly known as: ACTOS Take 30 mg by mouth daily.   ramipril 5 MG capsule Commonly known as: ALTACE Take 5 mg by mouth daily.   Evaristo Bury FlexTouch 100 UNIT/ML FlexTouch Pen Generic drug: insulin degludec Inject 20 Units into the skin daily.        Signature:  Coralyn Helling, MD [facility]Critical Care Pager - [phone] 05/30/2023, 12:05 PM

## 2023-05-30 NOTE — Patient Instructions (Signed)
Follow up in 1 year.

## 2023-06-11 DIAGNOSIS — I447 Left bundle-branch block, unspecified: Secondary | ICD-10-CM | POA: Diagnosis not present

## 2023-06-11 DIAGNOSIS — D510 Vitamin B12 deficiency anemia due to intrinsic factor deficiency: Secondary | ICD-10-CM | POA: Diagnosis not present

## 2023-06-11 DIAGNOSIS — E1129 Type 2 diabetes mellitus with other diabetic kidney complication: Secondary | ICD-10-CM | POA: Diagnosis not present

## 2023-06-11 DIAGNOSIS — R809 Proteinuria, unspecified: Secondary | ICD-10-CM | POA: Diagnosis not present

## 2023-06-11 DIAGNOSIS — I700 Atherosclerosis of aorta: Secondary | ICD-10-CM | POA: Diagnosis not present

## 2023-06-11 DIAGNOSIS — E785 Hyperlipidemia, unspecified: Secondary | ICD-10-CM | POA: Diagnosis not present

## 2023-06-11 DIAGNOSIS — N200 Calculus of kidney: Secondary | ICD-10-CM | POA: Diagnosis not present

## 2023-06-11 DIAGNOSIS — I10 Essential (primary) hypertension: Secondary | ICD-10-CM | POA: Diagnosis not present

## 2023-06-11 IMAGING — CT CT ABD-PELV W/O CM
2 of 4 series · 16 of 46 positions shown, 18 images · non-contrast
Comparison: None.

CLINICAL DATA: Hematuria.  Urinary frequency.  Penile discomfort.

EXAM:
CT ABDOMEN AND PELVIS WITHOUT CONTRAST
TECHNIQUE: Multidetector CT imaging of the abdomen and pelvis was performed
following the standard protocol without IV contrast.

[Series 2: axial st · axial · 0.87mm/px · z∈[+1011,+1401]mm · 13 of 90 slices shown, 15 images]
[im 6/90  soft-tissue]
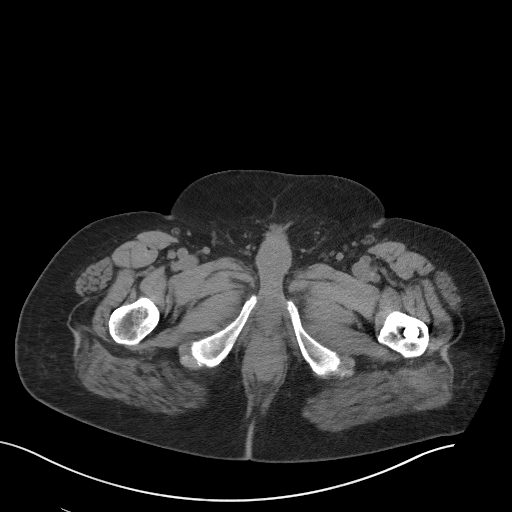
[im 6/90  bone]
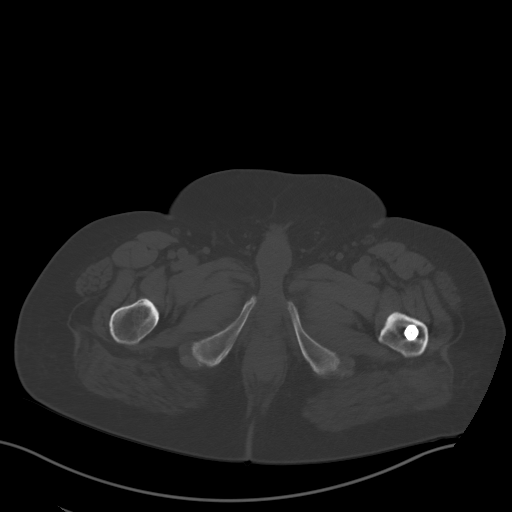
[im 12/90  soft-tissue]
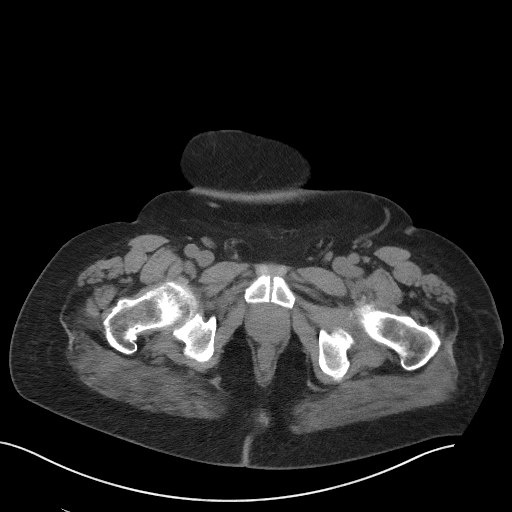
[im 18/90  soft-tissue]
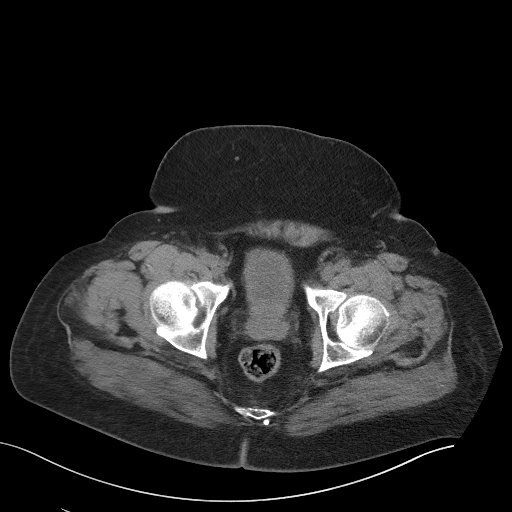
[im 24/90  soft-tissue]
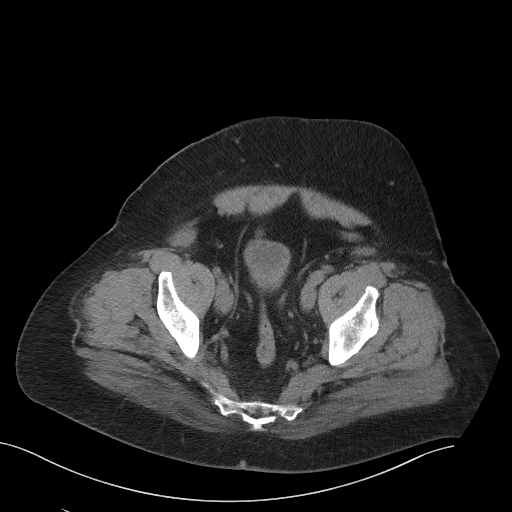
[im 30/90  soft-tissue]
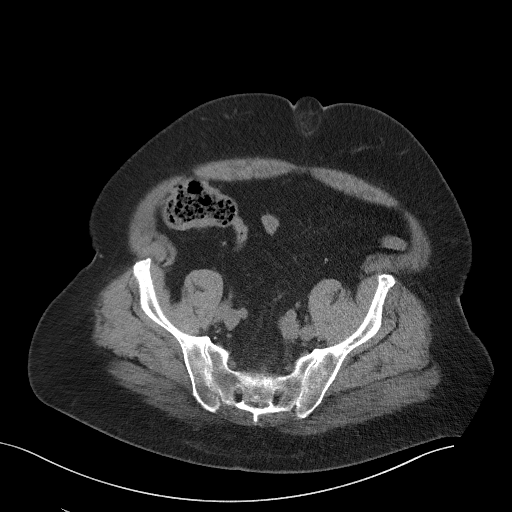
[im 36/90  soft-tissue]
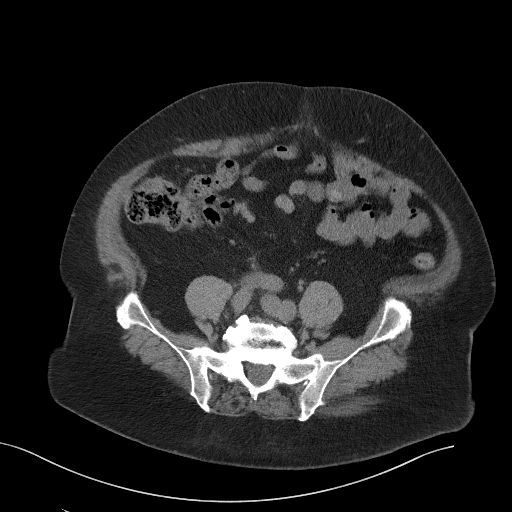
[im 48/90  soft-tissue]
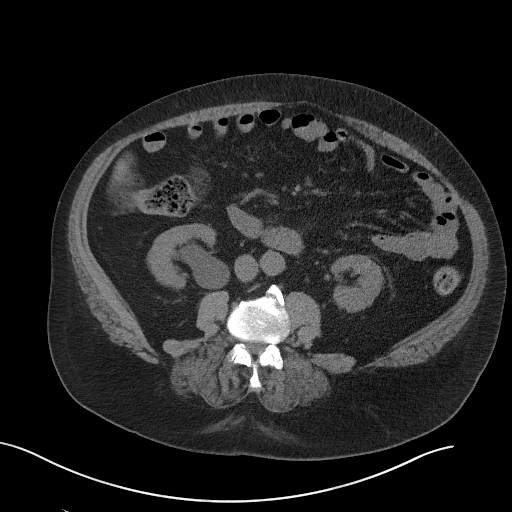
[im 54/90  soft-tissue]
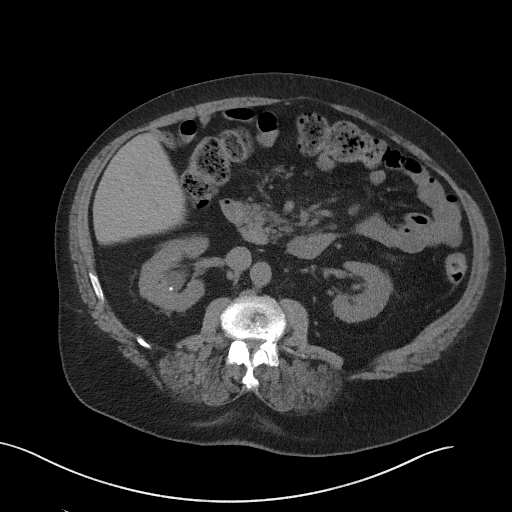
[im 60/90  soft-tissue]
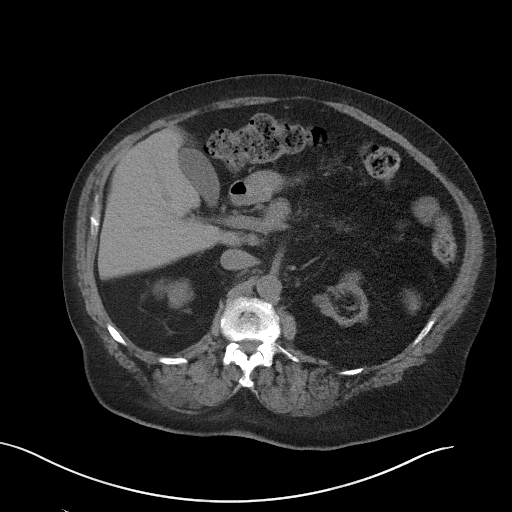
[im 60/90  bone]
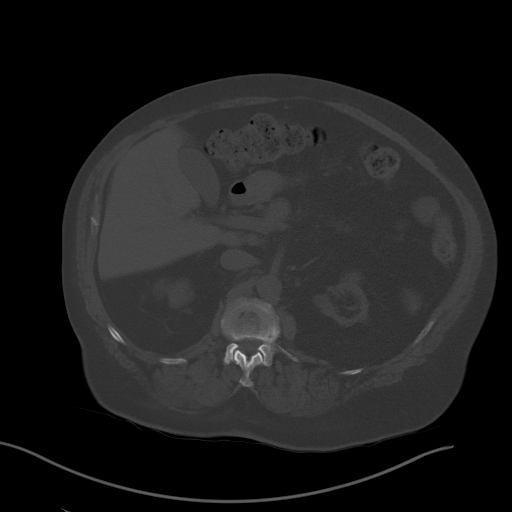
[im 66/90  soft-tissue]
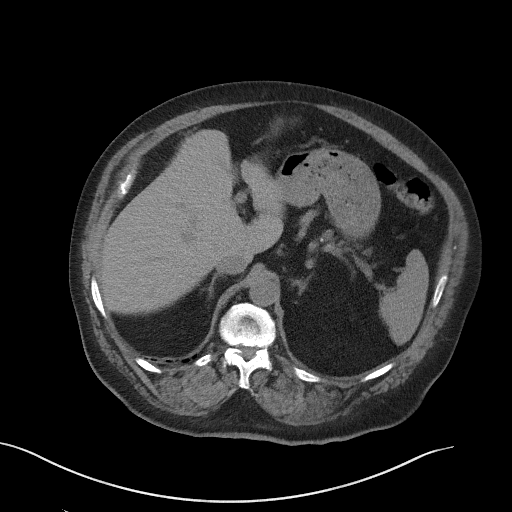
[im 72/90  soft-tissue]
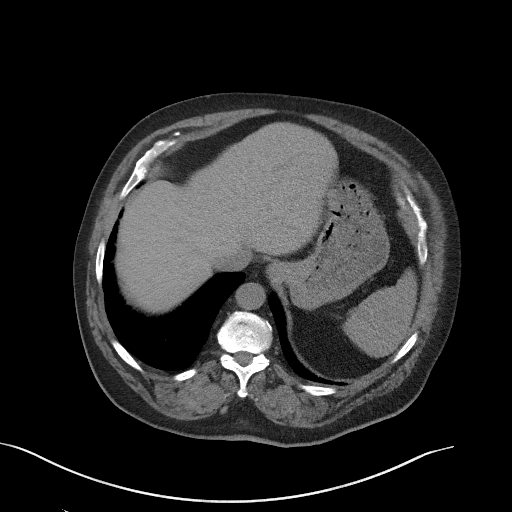
[im 78/90  soft-tissue]
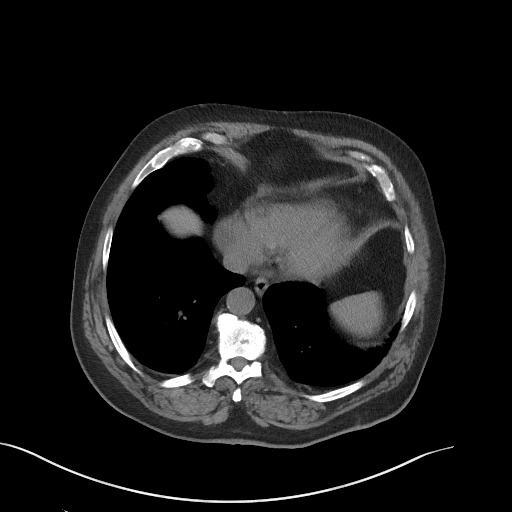
[im 84/90  soft-tissue]
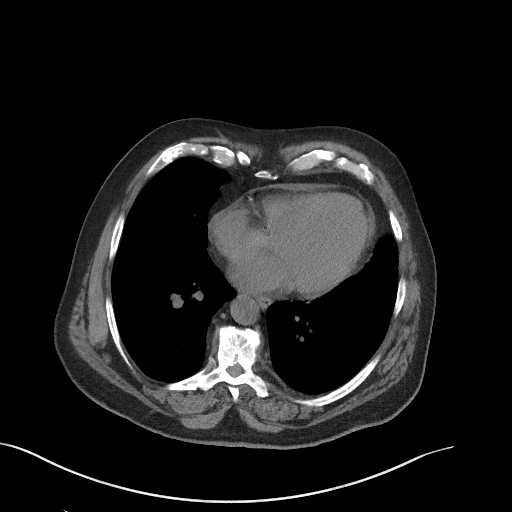

[Series 5: coronal st · coronal · 0.82mm/px · 3 of 143 slices shown]
[im 48/143  soft-tissue]
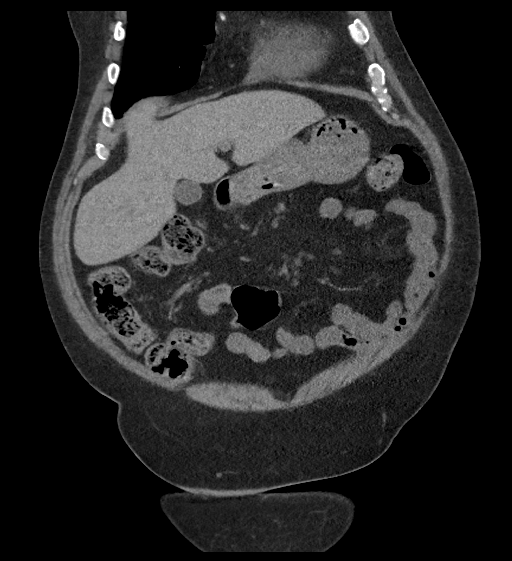
[im 64/143  soft-tissue]
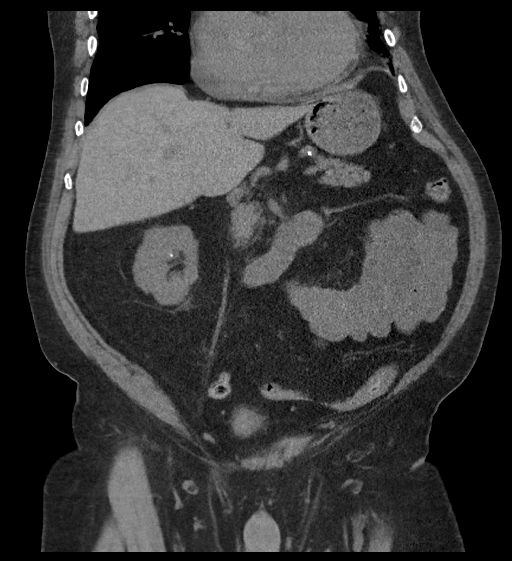
[im 79/143  soft-tissue]
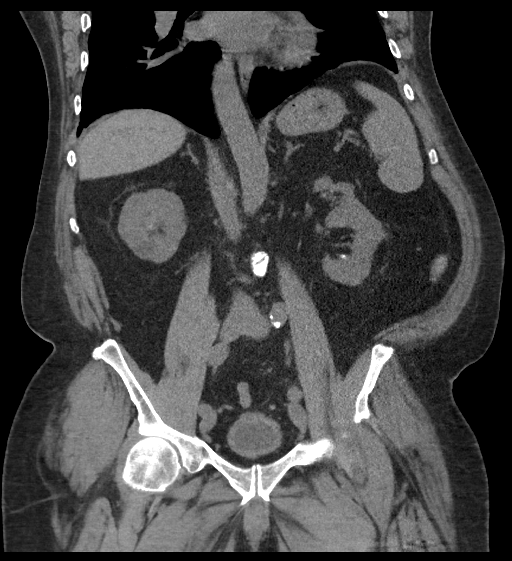

[16 of 46 positions shown; findings below may reference images not displayed]

FINDINGS: Lower chest: No acute findings.

Hepatobiliary: No mass visualized on this unenhanced exam.
Gallbladder is unremarkable. No evidence of biliary ductal
dilatation.

Pancreas: No mass or inflammatory process visualized on this
unenhanced exam.

Spleen:  Within normal limits in size.

Adrenals/Urinary tract: Several small less than 5 mm renal calculi
are seen bilaterally. Moderate right hydronephrosis is seen due to a
8 mm calculus in the proximal right ureter. Left renal parenchymal
atrophy and scarring is noted. Probable tiny subcapsular cysts also
noted in upper pole of left kidney. Congenital duplication of left
renal collecting system and ureter are demonstrated. Unremarkable
unopacified urinary bladder.

Stomach/Bowel: No evidence of obstruction, inflammatory process, or
abnormal fluid collections.

Vascular/Lymphatic: No pathologically enlarged lymph nodes
identified. No evidence of abdominal aortic aneurysm. Aortic
atherosclerotic calcification noted.

Reproductive:  No mass or other significant abnormality.

Other:  Small umbilical ventral hernia is seen containing only fat.

Musculoskeletal:  No suspicious bone lesions identified.
IMPRESSION: Moderate right hydronephrosis due to 8 mm proximal right ureteral
calculus.

Bilateral nephrolithiasis.

Congenital duplication of left renal collecting system and ureter,
with left renal parenchymal scarring and atrophy.

Small umbilical ventral hernia containing only fat.

Aortic Atherosclerosis ([id number]).

## 2023-06-16 DIAGNOSIS — I700 Atherosclerosis of aorta: Secondary | ICD-10-CM | POA: Diagnosis not present

## 2023-06-16 DIAGNOSIS — Z0001 Encounter for general adult medical examination with abnormal findings: Secondary | ICD-10-CM | POA: Diagnosis not present

## 2023-06-16 DIAGNOSIS — I10 Essential (primary) hypertension: Secondary | ICD-10-CM | POA: Diagnosis not present

## 2023-06-16 DIAGNOSIS — E785 Hyperlipidemia, unspecified: Secondary | ICD-10-CM | POA: Diagnosis not present

## 2023-06-16 DIAGNOSIS — I2510 Atherosclerotic heart disease of native coronary artery without angina pectoris: Secondary | ICD-10-CM | POA: Diagnosis not present

## 2023-06-16 DIAGNOSIS — E1122 Type 2 diabetes mellitus with diabetic chronic kidney disease: Secondary | ICD-10-CM | POA: Diagnosis not present

## 2023-08-20 DIAGNOSIS — M1711 Unilateral primary osteoarthritis, right knee: Secondary | ICD-10-CM | POA: Diagnosis not present

## 2023-08-20 DIAGNOSIS — M1712 Unilateral primary osteoarthritis, left knee: Secondary | ICD-10-CM | POA: Diagnosis not present

## 2023-08-20 DIAGNOSIS — M170 Bilateral primary osteoarthritis of knee: Secondary | ICD-10-CM | POA: Diagnosis not present

## 2023-08-26 ENCOUNTER — Telehealth (HOSPITAL_BASED_OUTPATIENT_CLINIC_OR_DEPARTMENT_OTHER): Payer: Self-pay | Admitting: Pulmonary Disease

## 2023-08-26 DIAGNOSIS — G4733 Obstructive sleep apnea (adult) (pediatric): Secondary | ICD-10-CM

## 2023-08-26 NOTE — Telephone Encounter (Signed)
Ok to send me orders for his CPAP supplies for now. Please place order and I will sign.

## 2023-08-26 NOTE — Telephone Encounter (Signed)
Patient states he is needing his CPAP supplies and spoke with his DME company and states they have to receive permission from [place] first. Would CPAP orders be signed off now w/ APP? Please advise   Patient is not due until Sept 2025

## 2023-08-26 NOTE — Telephone Encounter (Signed)
Order placed

## 2023-08-27 DIAGNOSIS — M170 Bilateral primary osteoarthritis of knee: Secondary | ICD-10-CM | POA: Diagnosis not present

## 2023-09-17 DIAGNOSIS — E1129 Type 2 diabetes mellitus with other diabetic kidney complication: Secondary | ICD-10-CM | POA: Diagnosis not present

## 2023-09-23 DIAGNOSIS — E1129 Type 2 diabetes mellitus with other diabetic kidney complication: Secondary | ICD-10-CM | POA: Diagnosis not present

## 2023-09-23 DIAGNOSIS — I10 Essential (primary) hypertension: Secondary | ICD-10-CM | POA: Diagnosis not present

## 2023-09-25 DIAGNOSIS — M170 Bilateral primary osteoarthritis of knee: Secondary | ICD-10-CM | POA: Diagnosis not present

## 2023-10-02 DIAGNOSIS — M170 Bilateral primary osteoarthritis of knee: Secondary | ICD-10-CM | POA: Diagnosis not present

## 2023-10-09 DIAGNOSIS — M170 Bilateral primary osteoarthritis of knee: Secondary | ICD-10-CM | POA: Diagnosis not present

## 2023-12-26 DIAGNOSIS — E1129 Type 2 diabetes mellitus with other diabetic kidney complication: Secondary | ICD-10-CM | POA: Diagnosis not present

## 2023-12-30 DIAGNOSIS — E1129 Type 2 diabetes mellitus with other diabetic kidney complication: Secondary | ICD-10-CM | POA: Diagnosis not present

## 2023-12-30 DIAGNOSIS — I10 Essential (primary) hypertension: Secondary | ICD-10-CM | POA: Diagnosis not present

## 2024-01-20 ENCOUNTER — Ambulatory Visit (HOSPITAL_COMMUNITY)
Admission: RE | Admit: 2024-01-20 | Discharge: 2024-01-20 | Disposition: A | Discharge status: Home / Self Care | Source: Ambulatory Visit | Attending: Urology | Admitting: Urology

## 2024-01-20 DIAGNOSIS — N200 Calculus of kidney: Secondary | ICD-10-CM | POA: Diagnosis not present

## 2024-01-20 DIAGNOSIS — N281 Cyst of kidney, acquired: Secondary | ICD-10-CM | POA: Diagnosis not present

## 2024-01-21 ENCOUNTER — Ambulatory Visit: Payer: Self-pay

## 2024-01-26 DIAGNOSIS — N200 Calculus of kidney: Secondary | ICD-10-CM | POA: Insufficient documentation

## 2024-01-26 DIAGNOSIS — N281 Cyst of kidney, acquired: Secondary | ICD-10-CM | POA: Insufficient documentation

## 2024-01-26 NOTE — Progress Notes (Signed)
 Name: William Washington DOB: Nov 03, 1945 MRN: [id number]  History of Present Illness: William Washington is a 77 y.o. male who presents today for follow up visit at [facility] [place]. GU History includes: 1. Kidney stones.  - Has had prior URS and ESWL procedures.  2. BPH.  - 02/07/2024: PSA normal (2.7).  At last visit with Dr. Claretta Croft on 01/24/2023: Asymptomatic.   Since last visit: 01/20/2024: RUS showed bilateral simple renal cysts. No GU stones, masses, or hydronephrosis; bladder unremarkable.  Today: He denies recent stone passage. He denies flank pain or abdominal pain. He denies fevers, nausea, or vomiting.  He reports slow urinary stream and nocturia x1-2. Denies urinary hesitancy, straining to void, sensations of incomplete emptying, urgency, frequency, dysuria, gross hematuria.    Medications: Current Outpatient Medications  Medication Sig Dispense Refill   tamsulosin  (FLOMAX ) 0.4 MG CAPS capsule Take 1 capsule (0.4 mg total) by mouth daily. 30 capsule 1   atorvastatin (LIPITOR) 40 MG tablet Take 40 mg by mouth at bedtime.      Cholecalciferol (EQL VITAMIN D3) 50 MCG (2000 UT) CAPS Take 2,000 Units by mouth daily.     HYDROcodone -acetaminophen  (NORCO/VICODIN) 5-325 MG tablet Take 1 tablet by mouth every 6 (six) hours as needed for severe pain. 12 tablet 0   Iron-Folic Acid -Vit B12 (IRON FORMULA PO) Take 5 mLs by mouth as directed. Liquid iron     loratadine-pseudoephedrine (CLARITIN-D 24-HOUR) 10-240 MG 24 hr tablet Take 1 tablet by mouth every other day.      pioglitazone (ACTOS) 30 MG tablet Take 30 mg by mouth daily.     ramipril (ALTACE) 5 MG capsule Take 5 mg by mouth daily.     TRESIBA FLEXTOUCH 100 UNIT/ML FlexTouch Pen Inject 20 Units into the skin daily.     vitamin B-12 (CYANOCOBALAMIN ) 1000 MCG tablet Take 1,000 mcg by mouth daily.     No current facility-administered medications for this visit.    Allergies: No Known Allergies  Past Medical  History:  Diagnosis Date   Colon polyp    Diabetes mellitus without complication (HCC)    Hypercholesteremia    Hypertension    Iron deficiency anemia    LBBB (left bundle branch block)    Nephrolithiasis    Sleep apnea    Thalassemia trait    Past Surgical History:  Procedure Laterality Date   COLONOSCOPY WITH PROPOFOL  N/A 07/07/2020   Surgeon: Vinetta Greening, DO; nonbleeding internal hemorrhoids, diverticulosis in the sigmoid and descending colon, Four 3-5 mm tubular adenomas.  Repeat in 5 years (2026).   CYSTOSCOPY WITH RETROGRADE PYELOGRAM, URETEROSCOPY AND STENT PLACEMENT Right 06/07/2021   Procedure: CYSTOSCOPY WITH RETROGRADE PYELOGRAM, URETEROSCOPY AND STENT PLACEMENT;  Surgeon: Marco Severs, MD;  Location: [facility];  Service: Urology;  Laterality: Right;   EXTRACORPOREAL SHOCK WAVE LITHOTRIPSY Right 05/08/2021   Procedure: EXTRACORPOREAL SHOCK WAVE LITHOTRIPSY (ESWL);  Surgeon: Marco Severs, MD;  Location: [facility];  Service: Urology;  Laterality: Right;   HOLMIUM LASER APPLICATION Right 06/07/2021   Procedure: HOLMIUM LASER APPLICATION;  Surgeon: Marco Severs, MD;  Location: [facility];  Service: Urology;  Laterality: Right;   MENISCECTOMY Left    ORIF FEMUR FRACTURE Left    POLYPECTOMY  07/07/2020   Procedure: POLYPECTOMY;  Surgeon: Vinetta Greening, DO;  Location: [facility];  Service: Endoscopy;;   Family History  Problem Relation Age of Onset   Hypertension Mother    Diabetes Mother    Cancer  Mother    Diabetes Father    Asthma Son    Severe sprains Neg Hx    Scoliosis Neg Hx    Social History   Socioeconomic History   Marital status: Significant Other    Spouse name: Not on file   Number of children: Not on file   Years of education: Not on file   Highest education level: Not on file  Occupational History   Not on file  Tobacco Use   Smoking status: Never   Smokeless tobacco: Never  Vaping Use   Vaping status: Never Used   Substance and Sexual Activity   Alcohol use: Yes    Comment: rare   Drug use: Never   Sexual activity: Not on file  Other Topics Concern   Not on file  Social History Narrative   Not on file   Social Drivers of Health   Financial Resource Strain: Low Risk  (08/08/2020)   Overall Financial Resource Strain (CARDIA)    Difficulty of Paying Living Expenses: Not hard at all  Food Insecurity: No Food Insecurity (08/08/2020)   Hunger Vital Sign    Worried About Running Out of Food in the Last Year: Never true    Ran Out of Food in the Last Year: Never true  Transportation Needs: No Transportation Needs (08/08/2020)   PRAPARE - Administrator, Civil Service (Medical): No    Lack of Transportation (Non-Medical): No  Physical Activity: Inactive (08/08/2020)   Exercise Vital Sign    Days of Exercise per Week: 0 days    Minutes of Exercise per Session: 0 min  Stress: No Stress Concern Present (08/08/2020)   [employer] of Occupational Health - Occupational Stress Questionnaire    Feeling of Stress : Not at all  Social Connections: Moderately Isolated (08/08/2020)   Social Connection and Isolation Panel [NHANES]    Frequency of Communication with Friends and Family: More than three times a week    Frequency of Social Gatherings with Friends and Family: More than three times a week    Attends Religious Services: Never    Database administrator or Organizations: No    Attends Banker Meetings: Never    Marital Status: Living with partner  Intimate Partner Violence: Not At Risk (08/08/2020)   Humiliation, Afraid, Rape, and Kick questionnaire    Fear of Current or Ex-Partner: No    Emotionally Abused: No    Physically Abused: No    Sexually Abused: No    SUBJECTIVE  Review of Systems Constitutional: Patient denies any unintentional weight loss or change in strength lntegumentary: Patient denies any rashes or pruritus Cardiovascular: Patient denies  chest pain or syncope Respiratory: Patient denies shortness of breath Gastrointestinal: As per HPI Musculoskeletal: Patient denies muscle cramps or weakness Neurologic: Patient denies convulsions or seizures Allergic/Immunologic: Patient denies recent allergic reaction(s) Hematologic/Lymphatic: Patient denies bleeding tendencies Endocrine: Patient denies heat/cold intolerance  GU: As per HPI.  OBJECTIVE Vitals:   01/27/24 1319  BP: 132/74  Pulse: (!) 109   There is no height or weight on file to calculate BMI.  Physical Examination Constitutional: No obvious distress; patient is non-toxic appearing  Cardiovascular: No visible lower extremity edema.  Respiratory: The patient does not have audible wheezing/stridor; respirations do not appear labored  Gastrointestinal: Abdomen non-distended Musculoskeletal: Normal ROM of UEs  Skin: No obvious rashes/open sores  Neurologic: CN 2-12 grossly intact Psychiatric: Answered questions appropriately with normal affect  Hematologic/Lymphatic/Immunologic: No obvious  bruises or sites of spontaneous bleeding  UA: negative  PVR: 65 ml  ASSESSMENT Nephrolithiasis - Plan: US  RENAL  Bilateral renal cysts - Plan: US  RENAL  Benign prostatic hyperplasia with weak urinary stream - Plan: tamsulosin  (FLOMAX ) 0.4 MG CAPS capsule  We reviewed recent imaging results; no acute findings. Will plan to follow up in 1 year with RUS for stone surveillance.  Advised trial of Flomax  (Tamsulosin ) 0.4 mg daily for BPH with mild LUTS; potential side effects discussed. Will plan to follow up in 1 month for symptom recheck.   Patient verbalized understanding of and agreement with current plan. All questions were answered.  PLAN Advised the following: Maintain adequate fluid intake daily. Flomax  (Tamsulosin ) 0.4 mg daily. Return in about 4 weeks (around 02/24/2024) for UA, PVR, & f/u with Griselda Lederer NP. 4. Renal/bladder ultrasound (RUS) and stone f/u with  Urology in 1 year.   Orders Placed This Encounter  Procedures   US  RENAL    Standing Status:   Future    Expected Date:   01/26/2025    Expiration Date:   01/26/2025    Reason for Exam (SYMPTOM  OR DIAGNOSIS REQUIRED):   kidney stone known or suspected    Preferred imaging location?:   [facility]    It has been explained that the patient is to follow regularly with their PCP in addition to all other providers involved in their care and to follow instructions provided by these respective offices. Patient advised to contact urology clinic if any urologic-pertaining questions, concerns, new symptoms or problems arise in the interim period.  There are no Patient Instructions on file for this visit.  Electronically signed by:  Lauretta Ponto, MSN, FNP-C, CUNP 01/27/2024 2:25 PM

## 2024-01-27 ENCOUNTER — Ambulatory Visit (INDEPENDENT_AMBULATORY_CARE_PROVIDER_SITE_OTHER): Payer: Medicare HMO | Admitting: Urology

## 2024-01-27 ENCOUNTER — Encounter: Payer: Self-pay | Admitting: Urology

## 2024-01-27 VITALS — BP 132/74 | HR 109

## 2024-01-27 DIAGNOSIS — N401 Enlarged prostate with lower urinary tract symptoms: Secondary | ICD-10-CM | POA: Diagnosis not present

## 2024-01-27 DIAGNOSIS — N281 Cyst of kidney, acquired: Secondary | ICD-10-CM | POA: Diagnosis not present

## 2024-01-27 DIAGNOSIS — N200 Calculus of kidney: Secondary | ICD-10-CM

## 2024-01-27 DIAGNOSIS — R3912 Poor urinary stream: Secondary | ICD-10-CM

## 2024-01-27 LAB — BLADDER SCAN AMB NON-IMAGING: Scan Result: 65

## 2024-01-27 MED ORDER — TAMSULOSIN HCL 0.4 MG PO CAPS
0.4000 mg | ORAL_CAPSULE | Freq: Every day | ORAL | 1 refills | Status: AC
Start: 2024-01-27 — End: ?

## 2024-02-05 ENCOUNTER — Ambulatory Visit (HOSPITAL_COMMUNITY)
Admission: RE | Admit: 2024-02-05 | Discharge: 2024-02-05 | Disposition: A | Discharge status: Home / Self Care | Source: Ambulatory Visit | Attending: Urology | Admitting: Urology

## 2024-02-05 ENCOUNTER — Encounter (HOSPITAL_COMMUNITY): Payer: Self-pay

## 2024-02-05 DIAGNOSIS — N200 Calculus of kidney: Secondary | ICD-10-CM

## 2024-02-05 DIAGNOSIS — N281 Cyst of kidney, acquired: Secondary | ICD-10-CM

## 2024-02-06 ENCOUNTER — Other Ambulatory Visit: Payer: Self-pay

## 2024-02-06 ENCOUNTER — Emergency Department (HOSPITAL_COMMUNITY)
Admission: EM | Admit: 2024-02-06 | Discharge: 2024-02-06 | Disposition: A | Discharge status: Home / Self Care | Attending: Emergency Medicine | Admitting: Emergency Medicine

## 2024-02-06 ENCOUNTER — Encounter (HOSPITAL_COMMUNITY): Payer: Self-pay

## 2024-02-06 ENCOUNTER — Emergency Department (HOSPITAL_COMMUNITY)

## 2024-02-06 DIAGNOSIS — M4802 Spinal stenosis, cervical region: Secondary | ICD-10-CM | POA: Diagnosis not present

## 2024-02-06 DIAGNOSIS — M542 Cervicalgia: Secondary | ICD-10-CM | POA: Insufficient documentation

## 2024-02-06 DIAGNOSIS — M47812 Spondylosis without myelopathy or radiculopathy, cervical region: Secondary | ICD-10-CM | POA: Diagnosis not present

## 2024-02-06 MED ORDER — PREDNISONE 50 MG PO TABS
50.0000 mg | ORAL_TABLET | ORAL | Status: AC
  Administered 2024-02-06: 50 mg via ORAL
  Filled 2024-02-06: qty 1

## 2024-02-06 MED ORDER — PREDNISONE 20 MG PO TABS: 40.0000 mg | ORAL_TABLET | Freq: Every day | ORAL | 0 refills | Status: AC

## 2024-02-06 MED ORDER — DICLOFENAC EPOLAMINE 1.3 % EX PTCH
1.0000 | MEDICATED_PATCH | Freq: Two times a day (BID) | CUTANEOUS | Status: DC
  Administered 2024-02-06: 1 via TRANSDERMAL
  Filled 2024-02-06 (×2): qty 1

## 2024-02-06 MED ORDER — TIZANIDINE HCL 4 MG PO TABS: 4.0000 mg | ORAL_TABLET | Freq: Four times a day (QID) | ORAL | 0 refills | Status: AC | PRN

## 2024-02-06 MED ORDER — DICLOFENAC EPOLAMINE 1.3 % EX PTCH: 1.0000 | MEDICATED_PATCH | Freq: Two times a day (BID) | CUTANEOUS | 1 refills | Status: AC

## 2024-02-06 MED ORDER — TIZANIDINE HCL 4 MG PO TABS
2.0000 mg | ORAL_TABLET | Freq: Once | ORAL | Status: AC
  Administered 2024-02-06: 2 mg via ORAL
  Filled 2024-02-06: qty 1

## 2024-02-06 NOTE — Discharge Instructions (Addendum)
 Muscle spasm of the neck can be painful, and take days to improve even with appropriate medication.  Please take all medication as prescribed and follow-up with your physician.  Return here for concerning changes in your condition.

## 2024-02-06 NOTE — ED Triage Notes (Signed)
 Pt arrived via POV c/o left side neck pain for over a week. Pt denies injury.

## 2024-02-06 NOTE — ED Provider Notes (Signed)
 [place] EMERGENCY DEPARTMENT AT [facility] Provider Note   CSN: [id number] Arrival date & time: 02/06/24  1209     History  Chief Complaint  Patient presents with   Neck Pain    William Washington is a 78 y.o. male.  HPI Patient presents with neck pain.  He notes that over the past month he has had intermittent episodes of left paraspinal cervical discomfort.  No weakness in his extremities including left arm, no chest pain, dyspnea.  Inconsistent relief with OTC medications, currently pain is minimal.    Home Medications Prior to Admission medications   Medication Sig Start Date End Date Taking? Authorizing Provider  atorvastatin (LIPITOR) 40 MG tablet Take 40 mg by mouth at bedtime.     [provider]  Cholecalciferol (EQL VITAMIN D3) 50 MCG (2000 UT) CAPS Take 2,000 Units by mouth daily.    [provider]  HYDROcodone -acetaminophen  (NORCO/VICODIN) 5-325 MG tablet Take 1 tablet by mouth every 6 (six) hours as needed for severe pain. 03/02/23   Dorenda Gandy, MD  Iron-Folic Acid -Vit B12 (IRON FORMULA PO) Take 5 mLs by mouth as directed. Liquid iron    [provider]  loratadine-pseudoephedrine (CLARITIN-D 24-HOUR) 10-240 MG 24 hr tablet Take 1 tablet by mouth every other day.     [provider]  pioglitazone (ACTOS) 30 MG tablet Take 30 mg by mouth daily.    [provider]  ramipril (ALTACE) 5 MG capsule Take 5 mg by mouth daily.    [provider]  tamsulosin  (FLOMAX ) 0.4 MG CAPS capsule Take 1 capsule (0.4 mg total) by mouth daily. 01/27/24   Larocco, Dyane Glance, FNP  TRESIBA FLEXTOUCH 100 UNIT/ML FlexTouch Pen Inject 20 Units into the skin daily. 07/12/20   [provider]  vitamin B-12 (CYANOCOBALAMIN ) 1000 MCG tablet Take 1,000 mcg by mouth daily.    [provider]      Allergies    Patient has no known allergies.    Review of Systems   Review of Systems  Physical Exam Updated Vital  Signs BP (!) 151/86 (BP Location: Right Arm)   Pulse 97   Temp 98.2 F (36.8 C) (Oral)   Resp 16   Ht 5\' 3"  (1.6 m)   Wt 85.6 kg   SpO2 100%   BMI 33.43 kg/m  Physical Exam Vitals and nursing note reviewed.  Constitutional:      General: He is not in acute distress.    Appearance: He is well-developed.  HENT:     Head: Normocephalic and atraumatic.  Eyes:     Conjunctiva/sclera: Conjunctivae normal.  Neck:   Cardiovascular:     Rate and Rhythm: Normal rate and regular rhythm.     Pulses: Normal pulses.  Pulmonary:     Effort: Pulmonary effort is normal. No respiratory distress.     Breath sounds: No stridor.  Abdominal:     General: There is no distension.  Musculoskeletal:     Cervical back: Normal range of motion and neck supple.  Skin:    General: Skin is warm and dry.  Neurological:     Mental Status: He is alert and oriented to person, place, and time.     Comments: Patient has symmetric strength in both upper extremities proximal, distal, denies any sensory changes.     ED Results / Procedures / Treatments   Labs (all labs ordered are listed, but only abnormal results are displayed) Labs Reviewed - No data  to display  EKG None  Radiology DG Cervical Spine Complete Result Date: 02/06/2024 CLINICAL DATA:  Left-sided neck pain EXAM: CERVICAL SPINE - COMPLETE 4+ VIEW COMPARISON:  None Available. FINDINGS: Straightening of the cervical spine. Trace retrolisthesis of C3 on C4. Vertebral body heights are maintained. Mild to moderate disc space narrowing at C2-C3, C3-C4, C4-C5 and C6-C7. Foraminal narrowing suspected at C3-C4 and C4-C5 but slightly limited positioning. Multilevel facet degenerative changes. Dens and lateral masses are within normal limits. Carotid vascular calcification IMPRESSION: Straightening of the cervical spine with multilevel degenerative changes. Electronically Signed   By: Esmeralda Hedge M.D.   On: 02/06/2024 15:28    Procedures Procedures     Medications Ordered in ED Medications  diclofenac  (FLECTOR ) 1.3 % 1 patch (1 patch Transdermal Patch Applied 02/06/24 1425)  tiZANidine  (ZANAFLEX ) tablet 2 mg (2 mg Oral Given 02/06/24 1424)  predniSONE  (DELTASONE ) tablet 50 mg (50 mg Oral Given 02/06/24 1425)    ED Course/ Medical Decision Making/ A&P                                 Medical Decision Making Patient presents with intermittent left paraspinal neck pain.  Absence of any other complaints, absence of physical exam asymmetry of strength reassuring for low suspicion of neurologic phenomena.  Some suspicion for paraspinal muscle etiology, patient had x-ray ordered from triage, reviewed, without substantial abnormalities, will start on appropriate anti-inflammatories, will follow-up with primary care.  Amount and/or Complexity of Data Reviewed Radiology: ordered and independent interpretation performed. Decision-making details documented in ED Course.  Risk Prescription drug management.  Final Clinical Impression(s) / ED Diagnoses Final diagnoses:  Neck pain    Rx / DC Orders ED Discharge Orders     None         Dorenda Gandy, MD 02/06/24 1538

## 2024-02-11 ENCOUNTER — Telehealth: Payer: Self-pay

## 2024-02-11 NOTE — Telephone Encounter (Signed)
 Open in error

## 2024-02-12 DIAGNOSIS — M542 Cervicalgia: Secondary | ICD-10-CM | POA: Diagnosis not present

## 2024-02-13 ENCOUNTER — Encounter (HOSPITAL_BASED_OUTPATIENT_CLINIC_OR_DEPARTMENT_OTHER): Payer: Self-pay | Admitting: Primary Care

## 2024-02-13 ENCOUNTER — Ambulatory Visit (HOSPITAL_BASED_OUTPATIENT_CLINIC_OR_DEPARTMENT_OTHER): Admitting: Primary Care

## 2024-02-13 VITALS — BP 127/67 | HR 82 | Ht 63.0 in | Wt 175.0 lb

## 2024-02-13 DIAGNOSIS — G4733 Obstructive sleep apnea (adult) (pediatric): Secondary | ICD-10-CM | POA: Diagnosis not present

## 2024-02-13 DIAGNOSIS — Z9981 Dependence on supplemental oxygen: Secondary | ICD-10-CM | POA: Diagnosis not present

## 2024-02-13 NOTE — Progress Notes (Signed)
 @Patient  ID: William Washington, male    DOB: [birth year], 78 y.o.   MRN: [id number]  No chief complaint on file.   Referring provider: Artemisa Bile, MD  HPI: 78 year old male. PMH significant for OSA on CPAP, HTN, mitral regurgitation, aortic stenosis, diabetes, hyperlipidemia, iron deficiency anemia.    02/13/2024- Interim hx  Discussed the use of AI scribe software for clinical note transcription with the patient, who gave verbal consent to proceed.  History of Present Illness   William Washington "William Washington" is a 78 year old male with sleep apnea who presents for a follow-up to establish care with a new provider.  He has a history of severe sleep apnea, confirmed by a sleep study in June 2024, which showed an average of 60 apneic events per hour and oxygen desaturation to 79%. Since getting replacement CPAP machine a year ago, his apnea score has improved to 1, and he reports significant improvement in sleep quality. He uses the CPAP machine consistently 99% of the time, averaging 8 hours and 33 minutes per night. The CPAP is set on auto settings with a minimum pressure of 5 and a maximum pressure of 20, and he uses a full face mask. Supplies are received from [employer].  He has a history of high blood pressure, high cholesterol, diabetes, iron deficiency anemia, and aortic stenosis. Prior to using the CPAP machine, he experienced shortness of breath during the day, which has since resolved. No current shortness of breath during the day.  He previously lived in [place]  and moved, resulting in the loss of his previous doctor.    Airview download 11/13/23-02/10/24 Usage days 89/90 days Average usage 8 hours 33 mins Pressure 5-20cm h20 (9cm h20-95%) Airleaks 4.6L/min AHI 1.0   Pulmonary testing PFT 05/21/23 >> FEV1 1.93 (87%), FEV1% 81, TLC 4.54 (80%), DLCO 68%  Chest imaging Coronary CT 11/04/22 >> dependent ATX, coronary calcium score 140    Sleep testing HST 02/19/23 >> AHI 60, SpO2 low 79%    No Known Allergies   There is no immunization history on file for this patient.  Past Medical History:  Diagnosis Date   Colon polyp    Diabetes mellitus without complication (HCC)    Hypercholesteremia    Hypertension    Iron deficiency anemia    LBBB (left bundle branch block)    Nephrolithiasis    Sleep apnea    Thalassemia trait     Tobacco History: Social History   Tobacco Use  Smoking Status Never  Smokeless Tobacco Never   Counseling given: Not Answered   Outpatient Medications Prior to Visit  Medication Sig Dispense Refill   atorvastatin (LIPITOR) 40 MG tablet Take 40 mg by mouth at bedtime.      Cholecalciferol (EQL VITAMIN D3) 50 MCG (2000 UT) CAPS Take 2,000 Units by mouth daily.     diclofenac  (FLECTOR ) 1.3 % PTCH Place 1 patch onto the skin 2 (two) times daily. 5 patch 1   HYDROcodone -acetaminophen  (NORCO/VICODIN) 5-325 MG tablet Take 1 tablet by mouth every 6 (six) hours as needed for severe pain. 12 tablet 0   Iron-Folic Acid -Vit B12 (IRON FORMULA PO) Take 5 mLs by mouth as directed. Liquid iron     loratadine-pseudoephedrine (CLARITIN-D 24-HOUR) 10-240 MG 24 hr tablet Take 1 tablet by mouth every other day.      pioglitazone (ACTOS) 30 MG tablet Take 30 mg by mouth daily.     predniSONE  (DELTASONE ) 20 MG tablet Take 2 tablets (40  mg total) by mouth daily with breakfast. For the next four days 8 tablet 0   ramipril (ALTACE) 5 MG capsule Take 5 mg by mouth daily.     tamsulosin  (FLOMAX ) 0.4 MG CAPS capsule Take 1 capsule (0.4 mg total) by mouth daily. 30 capsule 1   TRESIBA FLEXTOUCH 100 UNIT/ML FlexTouch Pen Inject 20 Units into the skin daily.     vitamin B-12 (CYANOCOBALAMIN ) 1000 MCG tablet Take 1,000 mcg by mouth daily.     No facility-administered medications prior to visit.    Review of Systems  Review of Systems  Constitutional: Negative.   HENT: Negative.    Respiratory: Negative.    Cardiovascular: Negative.   Psychiatric/Behavioral:  Negative.     Physical Exam  There were no vitals taken for this visit. Physical Exam Constitutional:      General: He is not in acute distress.    Appearance: Normal appearance. He is not ill-appearing.  HENT:     Head: Normocephalic and atraumatic.  Cardiovascular:     Rate and Rhythm: Normal rate and regular rhythm.  Pulmonary:     Effort: Pulmonary effort is normal.     Breath sounds: Normal breath sounds. No wheezing, rhonchi or rales.     Comments: CTA Musculoskeletal:        General: Normal range of motion.  Skin:    General: Skin is warm and dry.  Neurological:     General: No focal deficit present.     Mental Status: He is alert and oriented to person, place, and time. Mental status is at baseline.  Psychiatric:        Mood and Affect: Mood normal.        Behavior: Behavior normal.        Thought Content: Thought content normal.        Judgment: Judgment normal.      Lab Results:  CBC    Component Value Date/Time   WBC 7.6 08/05/2022 1003   RBC 4.39 08/05/2022 1003   HGB 8.8 (L) 08/05/2022 1003   HCT 28.1 (L) 08/05/2022 1003   PLT 225 08/05/2022 1003   MCV 64.0 (L) 08/05/2022 1003   MCH 20.0 (L) 08/05/2022 1003   MCHC 31.3 08/05/2022 1003   RDW 16.2 (H) 08/05/2022 1003   LYMPHSABS 1.3 05/16/2021 1446   MONOABS 0.5 05/16/2021 1446   EOSABS 0.1 05/16/2021 1446   BASOSABS 0.1 05/16/2021 1446    BMET    Component Value Date/Time   NA 140 10/30/2022 0830   K 4.0 10/30/2022 0830   CL 104 10/30/2022 0830   CO2 20 10/30/2022 0830   GLUCOSE 89 10/30/2022 0830   GLUCOSE 182 (H) 08/05/2022 1003   BUN 17 10/30/2022 0830   CREATININE 1.04 10/30/2022 0830   CALCIUM 8.9 10/30/2022 0830   GFRNONAA >60 08/05/2022 1003    BNP No results found for: "BNP"  ProBNP No results found for: "PROBNP"  Imaging: DG Cervical Spine Complete Result Date: 02/06/2024 CLINICAL DATA:  Left-sided neck pain EXAM: CERVICAL SPINE - COMPLETE 4+ VIEW COMPARISON:  None  Available. FINDINGS: Straightening of the cervical spine. Trace retrolisthesis of C3 on C4. Vertebral body heights are maintained. Mild to moderate disc space narrowing at C2-C3, C3-C4, C4-C5 and C6-C7. Foraminal narrowing suspected at C3-C4 and C4-C5 but slightly limited positioning. Multilevel facet degenerative changes. Dens and lateral masses are within normal limits. Carotid vascular calcification IMPRESSION: Straightening of the cervical spine with multilevel degenerative changes. Electronically Signed  By: Esmeralda Hedge M.D.   On: 02/06/2024 15:28   Ultrasound renal complete Result Date: 01/20/2024 CLINICAL DATA:  No nephrolithiasis. EXAM: RENAL / URINARY TRACT ULTRASOUND COMPLETE COMPARISON:  Jan 20, 2023 FINDINGS: Right Kidney: Renal measurements: 10.6 x 6.2 x 6.2 cm = volume: 209.8 mL. Echogenicity within normal limits. No hydronephrosis visualized. There is a 2.1 x 1.9 x 2.2 cm simple cyst in the upper to midpole right kidney. No follow-up is recommended. Left Kidney: Renal measurements: 11.7 x 5.4 x 4.7 cm = volume: 156.2 mL. Echogenicity within normal limits. No hydronephrosis visualized. Two simple cysts are identified within the left kidney, largest measures 1.4 x 1.2 x 1.4 cm. No follow-up is recommended. Bladder: Appears normal for degree of bladder distention. Other: None. IMPRESSION: No acute abnormality identified. No hydronephrosis bilaterally. Electronically Signed   By: Anna Barnes M.D.   On: 01/20/2024 14:03     Assessment & Plan:   1. OSA (obstructive sleep apnea) (Primary) - Ambulatory Referral for DME  Assessment and Plan    Severe obstructive sleep apnea Severe obstructive sleep apnea, HST in June 2024, showing 60 apneic events per hour and oxygen desaturation to 79%. Currently well-managed with CPAP therapy, reducing apneic events to 1 per hour. Reports improved sleep quality and resolution of daytime breathlessness with consistent CPAP use. CPAP settings are on auto  with a minimum pressure of 5 and maximum pressure of 20. Uses a full face mask and receives supplies from [employer]. - Continue CPAP nightly 4-6 hours or longer  - Send order for CPAP supplies to [facility]. - Follow-up annually    Antonio Baumgarten, NP 02/13/2024

## 2024-02-13 NOTE — Patient Instructions (Signed)
 -SEVERE OBSTRUCTIVE SLEEP APNEA: Severe obstructive sleep apnea is a condition where your airway becomes blocked during sleep, causing breathing pauses. Your condition is well-managed with CPAP therapy, which has significantly improved your sleep quality and reduced your apneic events. We will send an order for your CPAP supplies to [facility] and schedule an annual follow-up to renew your supplies and review your usage data.  INSTRUCTIONS: We will send an order for your CPAP supplies to [facility]. Please schedule an annual follow-up appointment to renew your CPAP supplies and review your usage data.  Follow-up 1 year with Beth NP or Dr. Villa Greaser in [place]   CPAP and BIPAP Information CPAP and BIPAP use air pressure to keep your airways open and help you breathe well. CPAP and BIPAP use different amounts of pressure. Your health care provider will tell you whether CPAP or BIPAP would be best for you. CPAP stands for continuous positive airway pressure. With CPAP, the amount of pressure stays the same while you breathe in and out. BIPAP stands for bi-level positive airway pressure. With BIPAP, the amount of pressure will be higher when you breathe in and lower when you breathe out. This allows you to take bigger breaths. CPAP or BIPAP may be used in the hospital or at home. You may need to have a sleep study before your provider can order a device for you to use at home. What are the advantages? CPAP and BIPAP are most often used for obstructive sleep apnea to keep the airways from collapsing when the muscles relax during sleep. CPAP or BIPAP can be used if you have: Chronic obstructive pulmonary disease. Heart failure. Medical conditions that cause muscle weakness. Other problems that cause breathing to be shallow, weak, or difficult. What are the risks? Your provider will talk with you about risks. These may include: Sores on your nose or face caused from the mask, prongs, or nasal pillows. Dry or  stuffy nose or nosebleeds. Feeling gassy or bloated. Sinus or lung infection if the equipment is not cleaned well. When should CPAP or BIPAP be used? In most cases, CPAP or BIPAP is used during sleep at night or whenever the main sleep time happens. It's also used during naps. People with some medical conditions may need to wear the mask when they're awake. Follow instructions from your provider about when to use your CPAP or BIPAP. What happens during CPAP or BIPAP?  Both CPAP and BIPAP use a small machine that uses electricity to create air pressure. A long tube connects the device to a plastic mask. Air is blown through the mask into your nose or mouth. The amount of pressure that's used to blow the air can be adjusted. Your provider will set the pressure setting and help you find the best mask for you. Tips for using the mask There are different types and sizes of masks. If your mask does not fit well, talk with your provider about getting a different one. Some common types of masks include: Full face masks, which fit over the mouth and nose. Nasal masks, which fit over the nose. Nasal pillow or prong masks, which fit into the nostrils. The mask needs to be snug to your face, so some people feel trapped or closed in at first. If you feel this way, you may need to get used to the mask. Hold the mask loosely over your nose or mouth and then gradually put the the mask on more snugly. Slowly increase the amount of time  you use the mask. If you have trouble with your mask not fitting well or leaking, talk with your provider. Do not stop using the mask. Tips for using the device Follow instructions from your provider about how to and how often to use the device. For home use, CPAP and BIPAP devices come from home health care companies. There are many different brands. Your health insurance company will help to decide which device you get. Keep the CPAP or BIPAP device and attachments clean. Ask  your home health care company or check the instruction book for cleaning instructions. Make sure the humidifier is filled with germ-free (sterile) water  and is working correctly. This will help prevent a dry or stuffy nose or nosebleeds. A nasal saline mist or spray may keep your nose from getting dry and sore. Do not eat or drink while the CPAP or BIPAP device is on. Food or drinks could get pushed into your lungs by the pressure of the CPAP or BIPAP. Follow these instructions at home: Take over-the-counter and prescription medicines only as told by your provider. Do not smoke, vape, or use nicotine or tobacco. Contact a health care provider if: You have redness or pressure sores on your head, face, mouth, or nose from the mask or headgear. You have trouble using the CPAP or BIPAP device. You have trouble going to sleep or staying asleep. Someone tells you that you snore even when wearing your CPAP or BIPAP device. Get help right away if: You have trouble breathing. You feel confused. These symptoms may be an emergency. Get help right away. Call 911. Do not wait to see if the symptoms will go away. Do not drive yourself to the hospital. This information is not intended to replace advice given to you by your health care provider. Make sure you discuss any questions you have with your health care provider. Document Revised: 12/18/2022 Document Reviewed: 12/18/2022 Elsevier Patient Education  2024 [employer].

## 2024-02-14 ENCOUNTER — Other Ambulatory Visit: Payer: Self-pay

## 2024-02-14 ENCOUNTER — Emergency Department (HOSPITAL_COMMUNITY)
Admission: EM | Admit: 2024-02-14 | Discharge: 2024-02-14 | Disposition: A | Discharge status: Home / Self Care | Attending: Emergency Medicine | Admitting: Emergency Medicine

## 2024-02-14 ENCOUNTER — Encounter (HOSPITAL_COMMUNITY): Payer: Self-pay

## 2024-02-14 DIAGNOSIS — W228XXA Striking against or struck by other objects, initial encounter: Secondary | ICD-10-CM | POA: Insufficient documentation

## 2024-02-14 DIAGNOSIS — S61432A Puncture wound without foreign body of left hand, initial encounter: Secondary | ICD-10-CM | POA: Diagnosis not present

## 2024-02-14 DIAGNOSIS — Z79899 Other long term (current) drug therapy: Secondary | ICD-10-CM | POA: Diagnosis not present

## 2024-02-14 DIAGNOSIS — S6992XA Unspecified injury of left wrist, hand and finger(s), initial encounter: Secondary | ICD-10-CM | POA: Diagnosis present

## 2024-02-14 DIAGNOSIS — I10 Essential (primary) hypertension: Secondary | ICD-10-CM | POA: Insufficient documentation

## 2024-02-14 DIAGNOSIS — Z7982 Long term (current) use of aspirin: Secondary | ICD-10-CM | POA: Diagnosis not present

## 2024-02-14 DIAGNOSIS — E119 Type 2 diabetes mellitus without complications: Secondary | ICD-10-CM | POA: Diagnosis not present

## 2024-02-14 NOTE — ED Triage Notes (Signed)
 Pt here due to a wound that wont stop bleeding. Wound is small on left hand and has been bleeding for an hour. Unsure was he did to it.

## 2024-02-14 NOTE — ED Provider Notes (Signed)
 [facility] Provider Note   CSN: [id number] Arrival date & time: 02/14/24  2020     History  Chief Complaint  Patient presents with   Puncture Wound    William Washington is a 78 y.o. male.  HPI This patient is a 78 year old male, history of diabetes and high cholesterol as well as hypertension, he takes a baby aspirin, reports that he accidentally punctured his left hand over the ulnar aspect of the palm in the mid hand yesterday just proximal to the MCP of the fifth digit.  He had a small amount of bleeding which seemed to stop yesterday but today seem to be continuing.  It is a very small amount but he states it will not completely stop.  No pain, no other symptoms, he is holding a piece of gauze on it    Home Medications Prior to Admission medications   Medication Sig Start Date End Date Taking? Authorizing Provider  atorvastatin (LIPITOR) 40 MG tablet Take 40 mg by mouth at bedtime.     [provider]  Cholecalciferol (EQL VITAMIN D3) 50 MCG (2000 UT) CAPS Take 2,000 Units by mouth daily.    [provider]  diclofenac  (FLECTOR ) 1.3 % PTCH Place 1 patch onto the skin 2 (two) times daily. 02/06/24   Dorenda Gandy, MD  HYDROcodone -acetaminophen  (NORCO/VICODIN) 5-325 MG tablet Take 1 tablet by mouth every 6 (six) hours as needed for severe pain. 03/02/23   Dorenda Gandy, MD  Iron-Folic Acid -Vit B12 (IRON FORMULA PO) Take 5 mLs by mouth as directed. Liquid iron    [provider]  loratadine-pseudoephedrine (CLARITIN-D 24-HOUR) 10-240 MG 24 hr tablet Take 1 tablet by mouth every other day.     [provider]  pioglitazone (ACTOS) 30 MG tablet Take 30 mg by mouth daily.    [provider]  predniSONE  (DELTASONE ) 20 MG tablet Take 2 tablets (40 mg total) by mouth daily with breakfast. For the next four days 02/06/24   Dorenda Gandy, MD  ramipril (ALTACE) 5 MG capsule Take 5 mg by mouth daily.     [provider]  tamsulosin  (FLOMAX ) 0.4 MG CAPS capsule Take 1 capsule (0.4 mg total) by mouth daily. 01/27/24   Larocco, Dyane Glance, FNP  TRESIBA FLEXTOUCH 100 UNIT/ML FlexTouch Pen Inject 20 Units into the skin daily. 07/12/20   [provider]  vitamin B-12 (CYANOCOBALAMIN ) 1000 MCG tablet Take 1,000 mcg by mouth daily.    [provider]      Allergies    Patient has no known allergies.    Review of Systems   Review of Systems  All other systems reviewed and are negative.   Physical Exam Updated Vital Signs BP (!) 154/76   Pulse 99   Ht 1.6 m (5\' 3" )   Wt 79 kg   SpO2 99%   BMI 30.85 kg/m  Physical Exam Vitals and nursing note reviewed.  Constitutional:      Appearance: He is well-developed. He is not diaphoretic.  HENT:     Head: Normocephalic and atraumatic.  Eyes:     General:        Right eye: No discharge.        Left eye: No discharge.     Conjunctiva/sclera: Conjunctivae normal.  Pulmonary:     Effort: Pulmonary effort is normal. No respiratory distress.  Skin:    General: Skin is warm and dry.     Findings: No erythema  or rash.     Comments: 2 small skin abrasion puncture type injuries on the lateral aspect of the ulnar aspect of the palmar aspect of the hand on the left.  No active bleeding  Neurological:     Mental Status: He is alert.     Coordination: Coordination normal.     ED Results / Procedures / Treatments   Labs (all labs ordered are listed, but only abnormal results are displayed) Labs Reviewed - No data to display  EKG None  Radiology No results found.  Procedures Procedures    Medications Ordered in ED Medications - No data to display  ED Course/ Medical Decision Making/ A&P                                 Medical Decision Making  No swelling No tenderness No bleeding Will put quick clot gauze with a half pressure dressing, patient agreeable, vitals unremarkable        Final Clinical  Impression(s) / ED Diagnoses Final diagnoses:  Puncture wound of left hand without foreign body, initial encounter    Rx / DC Orders ED Discharge Orders     None         Early Glisson, MD 02/14/24 2050

## 2024-02-14 NOTE — Discharge Instructions (Signed)
 We have placed a special dressing on your hand that has a medicated gauze which will help the bleeding to stop.  There may still be a small amount of bleeding tonight but this should gradually stop.  Please do not take any more aspirin until this is completely healed.  Return to the ER for severe worsening bleeding otherwise see your doctor in 2 to 3 days for a wound check.

## 2024-02-18 DIAGNOSIS — H5203 Hypermetropia, bilateral: Secondary | ICD-10-CM | POA: Diagnosis not present

## 2024-02-18 DIAGNOSIS — H2640 Unspecified secondary cataract: Secondary | ICD-10-CM | POA: Diagnosis not present

## 2024-02-18 DIAGNOSIS — Z961 Presence of intraocular lens: Secondary | ICD-10-CM | POA: Diagnosis not present

## 2024-02-18 DIAGNOSIS — H52223 Regular astigmatism, bilateral: Secondary | ICD-10-CM | POA: Diagnosis not present

## 2024-02-18 DIAGNOSIS — E113293 Type 2 diabetes mellitus with mild nonproliferative diabetic retinopathy without macular edema, bilateral: Secondary | ICD-10-CM | POA: Diagnosis not present

## 2024-02-18 DIAGNOSIS — H524 Presbyopia: Secondary | ICD-10-CM | POA: Diagnosis not present

## 2024-02-19 ENCOUNTER — Other Ambulatory Visit: Payer: Self-pay | Admitting: Urology

## 2024-02-19 DIAGNOSIS — N401 Enlarged prostate with lower urinary tract symptoms: Secondary | ICD-10-CM

## 2024-02-24 ENCOUNTER — Encounter: Payer: Self-pay | Admitting: Urology

## 2024-02-24 ENCOUNTER — Ambulatory Visit (INDEPENDENT_AMBULATORY_CARE_PROVIDER_SITE_OTHER): Admitting: Urology

## 2024-02-24 VITALS — BP 125/75 | HR 88

## 2024-02-24 DIAGNOSIS — N281 Cyst of kidney, acquired: Secondary | ICD-10-CM | POA: Diagnosis not present

## 2024-02-24 DIAGNOSIS — R351 Nocturia: Secondary | ICD-10-CM | POA: Diagnosis not present

## 2024-02-24 DIAGNOSIS — N401 Enlarged prostate with lower urinary tract symptoms: Secondary | ICD-10-CM

## 2024-02-24 DIAGNOSIS — N200 Calculus of kidney: Secondary | ICD-10-CM

## 2024-02-24 DIAGNOSIS — R3912 Poor urinary stream: Secondary | ICD-10-CM | POA: Diagnosis not present

## 2024-02-24 LAB — BLADDER SCAN AMB NON-IMAGING: Scan Result: 0

## 2024-02-24 MED ORDER — TAMSULOSIN HCL 0.4 MG PO CAPS
0.4000 mg | ORAL_CAPSULE | Freq: Every day | ORAL | 3 refills | Status: AC
Start: 2024-02-24 — End: ?

## 2024-02-24 NOTE — Progress Notes (Signed)
 Name: William Washington DOB: [birth year] MRN: [id number]  History of Present Illness: William Washington is a 78 y.o. male who presents today for follow up visit at [facility]. Relevant History includes: 1. BPH with LUTS (slow urinary stream and nocturia x1-2).  - 02/07/2024: PSA normal (2.7). 2. Kidney stones.  - Has had prior URS and ESWL procedures.  - 01/20/2024: RUS showed no stones.  3. Bilateral simple renal cysts.  At last visit on 01/27/2024: For LUTS the plan was to trial Flomax .  Today: He reports symptomatic improvement since starting Flomax  daily - has had a somewhat stronger urinary stream and is now voiding only 1x/night on average. Has even had a few nights were he didn't wake up at all. He denies urinary urgency, dysuria, gross hematuria, hesitancy, straining to void, or sensations of incomplete emptying.  Medications: Current Outpatient Medications  Medication Sig Dispense Refill   atorvastatin (LIPITOR) 40 MG tablet Take 40 mg by mouth at bedtime.      Cholecalciferol (EQL VITAMIN D3) 50 MCG (2000 UT) CAPS Take 2,000 Units by mouth daily.     diclofenac  (FLECTOR ) 1.3 % PTCH Place 1 patch onto the skin 2 (two) times daily. 5 patch 1   HYDROcodone -acetaminophen  (NORCO/VICODIN) 5-325 MG tablet Take 1 tablet by mouth every 6 (six) hours as needed for severe pain. 12 tablet 0   Iron-Folic Acid -Vit B12 (IRON FORMULA PO) Take 5 mLs by mouth as directed. Liquid iron     loratadine-pseudoephedrine (CLARITIN-D 24-HOUR) 10-240 MG 24 hr tablet Take 1 tablet by mouth every other day.      pioglitazone (ACTOS) 30 MG tablet Take 30 mg by mouth daily.     predniSONE  (DELTASONE ) 20 MG tablet Take 2 tablets (40 mg total) by mouth daily with breakfast. For the next four days 8 tablet 0   ramipril (ALTACE) 5 MG capsule Take 5 mg by mouth daily.     TRESIBA FLEXTOUCH 100 UNIT/ML FlexTouch Pen Inject 20 Units into the skin daily.     vitamin B-12 (CYANOCOBALAMIN ) 1000 MCG  tablet Take 1,000 mcg by mouth daily.     tamsulosin  (FLOMAX ) 0.4 MG CAPS capsule Take 1 capsule (0.4 mg total) by mouth daily. 90 capsule 3   No current facility-administered medications for this visit.    Allergies: No Known Allergies  Past Medical History:  Diagnosis Date   Colon polyp    Diabetes mellitus without complication (HCC)    Hypercholesteremia    Hypertension    Iron deficiency anemia    LBBB (left bundle branch block)    Nephrolithiasis    Sleep apnea    Thalassemia trait    Past Surgical History:  Procedure Laterality Date   COLONOSCOPY WITH PROPOFOL  N/A 07/07/2020   Surgeon: Vinetta Greening, DO; nonbleeding internal hemorrhoids, diverticulosis in the sigmoid and descending colon, Four 3-5 mm tubular adenomas.  Repeat in 5 years (2026).   CYSTOSCOPY WITH RETROGRADE PYELOGRAM, URETEROSCOPY AND STENT PLACEMENT Right 06/07/2021   Procedure: CYSTOSCOPY WITH RETROGRADE PYELOGRAM, URETEROSCOPY AND STENT PLACEMENT;  Surgeon: Marco Severs, MD;  Location: [facility];  Service: Urology;  Laterality: Right;   EXTRACORPOREAL SHOCK WAVE LITHOTRIPSY Right 05/08/2021   Procedure: EXTRACORPOREAL SHOCK WAVE LITHOTRIPSY (ESWL);  Surgeon: Marco Severs, MD;  Location: [facility];  Service: Urology;  Laterality: Right;   HOLMIUM LASER APPLICATION Right 06/07/2021   Procedure: HOLMIUM LASER APPLICATION;  Surgeon: Marco Severs, MD;  Location: [facility];  Service: Urology;  Laterality:  Right;   MENISCECTOMY Left    ORIF FEMUR FRACTURE Left    POLYPECTOMY  07/07/2020   Procedure: POLYPECTOMY;  Surgeon: Vinetta Greening, DO;  Location: [facility];  Service: Endoscopy;;   Family History  Problem Relation Age of Onset   Hypertension Mother    Diabetes Mother    Cancer Mother    Diabetes Father    Asthma Son    Severe sprains Neg Hx    Scoliosis Neg Hx    Social History   Socioeconomic History   Marital status: Significant Other    Spouse name: Not on file    Number of children: Not on file   Years of education: Not on file   Highest education level: Not on file  Occupational History   Not on file  Tobacco Use   Smoking status: Never   Smokeless tobacco: Never  Vaping Use   Vaping status: Never Used  Substance and Sexual Activity   Alcohol use: Yes    Comment: rare   Drug use: Never   Sexual activity: Not on file  Other Topics Concern   Not on file  Social History Narrative   Not on file   Social Drivers of Health   Financial Resource Strain: Low Risk  (08/08/2020)   Overall Financial Resource Strain (CARDIA)    Difficulty of Paying Living Expenses: Not hard at all  Food Insecurity: No Food Insecurity (08/08/2020)   Hunger Vital Sign    Worried About Running Out of Food in the Last Year: Never true    Ran Out of Food in the Last Year: Never true  Transportation Needs: No Transportation Needs (08/08/2020)   PRAPARE - Administrator, Civil Service (Medical): No    Lack of Transportation (Non-Medical): No  Physical Activity: Inactive (08/08/2020)   Exercise Vital Sign    Days of Exercise per Week: 0 days    Minutes of Exercise per Session: 0 min  Stress: No Stress Concern Present (08/08/2020)   [employer] of Occupational Health - Occupational Stress Questionnaire    Feeling of Stress : Not at all  Social Connections: Moderately Isolated (08/08/2020)   Social Connection and Isolation Panel    Frequency of Communication with Friends and Family: More than three times a week    Frequency of Social Gatherings with Friends and Family: More than three times a week    Attends Religious Services: Never    Database administrator or Organizations: No    Attends Banker Meetings: Never    Marital Status: Living with partner  Intimate Partner Violence: Not At Risk (08/08/2020)   Humiliation, Afraid, Rape, and Kick questionnaire    Fear of Current or Ex-Partner: No    Emotionally Abused: No    Physically  Abused: No    Sexually Abused: No    SUBJECTIVE  Review of Systems Constitutional: Patient denies any unintentional weight loss or change in strength lntegumentary: Patient denies any rashes or pruritus Cardiovascular: Patient denies chest pain or syncope Respiratory: Patient denies shortness of breath Gastrointestinal: Patient reports intermittent constipation  Musculoskeletal: Patient denies muscle cramps or weakness Neurologic: Patient denies convulsions or seizures Allergic/Immunologic: Patient denies recent allergic reaction(s) Hematologic/Lymphatic: Patient denies bleeding tendencies Endocrine: Patient denies heat/cold intolerance  GU: As per HPI.  OBJECTIVE Vitals:   02/24/24 1508  BP: 125/75  Pulse: 88   There is no height or weight on file to calculate BMI.  Physical Examination Constitutional: No  obvious distress; patient is non-toxic appearing  Cardiovascular: No visible lower extremity edema.  Respiratory: The patient does not have audible wheezing/stridor; respirations do not appear labored  Gastrointestinal: Abdomen non-distended Musculoskeletal: Normal ROM of UEs  Skin: No obvious rashes/open sores  Neurologic: CN 2-12 grossly intact Psychiatric: Answered questions appropriately with normal affect  Hematologic/Lymphatic/Immunologic: No obvious bruises or sites of spontaneous bleeding  UA: negative  PVR: 0 ml  ASSESSMENT Benign prostatic hyperplasia with weak urinary stream - Plan: Urinalysis, Routine w reflex microscopic, BLADDER SCAN AMB NON-IMAGING, tamsulosin  (FLOMAX ) 0.4 MG CAPS capsule  Nephrolithiasis - Plan: DG Abd 1 View, US  RENAL  Bilateral renal cysts - Plan: US  RENAL  Nocturia  LUTS improved with Flomax  (Tamsulosin ) 0.4 mg daily; he elects to continue that. Will plan to follow up in 1 year with KUB and RUS for stone surveillance or sooner if needed. Patient verbalized understanding of and agreement with current plan. All questions were  answered.  PLAN Advised the following: Continue Flomax  (Tamsulosin ) 0.4 mg daily. Return for BPH, Stone, with UA & PVR, will need KUB & RUS prior.  Orders Placed This Encounter  Procedures   DG Abd 1 View    Standing Status:   Future    Expected Date:   02/23/2025    Expiration Date:   02/23/2025    Reason for Exam (SYMPTOM  OR DIAGNOSIS REQUIRED):   kidney stone    Preferred imaging location?:   [facility]   US  RENAL    Standing Status:   Future    Expected Date:   02/23/2025    Expiration Date:   02/23/2025    Reason for Exam (SYMPTOM  OR DIAGNOSIS REQUIRED):   kidney stone known or suspected    Preferred imaging location?:   [facility]   Urinalysis, Routine w reflex microscopic   BLADDER SCAN AMB NON-IMAGING    It has been explained that the patient is to follow regularly with their PCP in addition to all other providers involved in their care and to follow instructions provided by these respective offices. Patient advised to contact urology clinic if any urologic-pertaining questions, concerns, new symptoms or problems arise in the interim period.  There are no Patient Instructions on file for this visit.  Electronically signed by:  Lauretta Ponto, MSN, FNP-C, CUNP 02/24/2024 3:43 PM

## 2024-02-25 LAB — URINALYSIS, ROUTINE W REFLEX MICROSCOPIC
Bilirubin, UA: NEGATIVE
Glucose, UA: NEGATIVE
Ketones, UA: NEGATIVE
Leukocytes,UA: NEGATIVE
Nitrite, UA: NEGATIVE
Protein,UA: NEGATIVE
RBC, UA: NEGATIVE
Specific Gravity, UA: 1.02 (ref 1.005–1.030)
Urobilinogen, Ur: 0.2 mg/dL (ref 0.2–1.0)
pH, UA: 6 (ref 5.0–7.5)

## 2024-03-31 DIAGNOSIS — Z79899 Other long term (current) drug therapy: Secondary | ICD-10-CM | POA: Diagnosis not present

## 2024-03-31 DIAGNOSIS — D649 Anemia, unspecified: Secondary | ICD-10-CM | POA: Diagnosis not present

## 2024-03-31 DIAGNOSIS — E1129 Type 2 diabetes mellitus with other diabetic kidney complication: Secondary | ICD-10-CM | POA: Diagnosis not present

## 2024-04-07 DIAGNOSIS — R800 Isolated proteinuria: Secondary | ICD-10-CM | POA: Diagnosis not present

## 2024-04-07 DIAGNOSIS — E1129 Type 2 diabetes mellitus with other diabetic kidney complication: Secondary | ICD-10-CM | POA: Diagnosis not present

## 2024-04-07 DIAGNOSIS — I10 Essential (primary) hypertension: Secondary | ICD-10-CM | POA: Diagnosis not present

## 2024-04-07 DIAGNOSIS — D509 Iron deficiency anemia, unspecified: Secondary | ICD-10-CM | POA: Diagnosis not present

## 2024-04-14 DIAGNOSIS — M170 Bilateral primary osteoarthritis of knee: Secondary | ICD-10-CM | POA: Diagnosis not present

## 2024-04-21 ENCOUNTER — Inpatient Hospital Stay: Attending: Oncology | Admitting: Oncology

## 2024-04-21 DIAGNOSIS — N189 Chronic kidney disease, unspecified: Secondary | ICD-10-CM | POA: Insufficient documentation

## 2024-04-21 DIAGNOSIS — E538 Deficiency of other specified B group vitamins: Secondary | ICD-10-CM | POA: Insufficient documentation

## 2024-04-21 DIAGNOSIS — D509 Iron deficiency anemia, unspecified: Secondary | ICD-10-CM | POA: Diagnosis not present

## 2024-04-21 DIAGNOSIS — M1712 Unilateral primary osteoarthritis, left knee: Secondary | ICD-10-CM | POA: Diagnosis not present

## 2024-04-21 DIAGNOSIS — M1711 Unilateral primary osteoarthritis, right knee: Secondary | ICD-10-CM | POA: Diagnosis not present

## 2024-04-21 DIAGNOSIS — D563 Thalassemia minor: Secondary | ICD-10-CM | POA: Diagnosis not present

## 2024-04-21 DIAGNOSIS — R718 Other abnormality of red blood cells: Secondary | ICD-10-CM

## 2024-04-21 DIAGNOSIS — M170 Bilateral primary osteoarthritis of knee: Secondary | ICD-10-CM | POA: Diagnosis not present

## 2024-04-21 NOTE — Progress Notes (Signed)
 [facility] [address] [place], [place] [address]   CLINIC:  Medical Oncology/Hematology  PCP:  Sheryle Carwin, MD [address] [place] [address] [phone]   REASON FOR VISIT:  Follow-up for iron deficiency anemia and thalassemia trait  CURRENT THERAPY: Intermittent IV iron infusions  INTERVAL HISTORY:  Mr. Tolliver 78 y.o. male returns for routine follow-up of microcytic anemia, which is secondary to thalassemia trait and iron deficiency.  Patient was last seen in clinic in 2022.  Patient has past medical history significant for OSA on CPAP, hypertension, mitral regurgitation, aortic stenosis, diabetes and hyperlipidemia.  No changes in his baseline health since his last visit with us .  He does have chronic neck pain and is followed by Ortho.  Patient has noticed he has been feeling more dizzy and tired than usual so he went to his PCP who drew his iron levels.  He has been on liquid iron for several years now without any issue.  He has received IV iron in the past last was in September/October 2021.  Reports his appetite is 100% energy levels are 40%.  He denies any pain.  He denies any bleeding.  He did note he had a laceration to a finger and had trouble stopping the bleeding.  Otherwise, denies any rectal bleeding, melena or hematochezia.  He did have a colonoscopy back in 2021 which showed several polyps and diverticulosis and several internal hemorrhoids that were nonbleeding.  No recent hospitalizations, surgeries, or changes in baseline health status.   REVIEW OF SYSTEMS:  Review of Systems  Constitutional:  Positive for fatigue.  HENT:   Positive for trouble swallowing.   Neurological:  Positive for dizziness and numbness.      PAST MEDICAL/SURGICAL HISTORY:  Past Medical History:  Diagnosis Date   Colon polyp    Diabetes mellitus without complication (HCC)    Hypercholesteremia    Hypertension    Iron deficiency anemia    LBBB  (left bundle branch block)    Nephrolithiasis    Sleep apnea    Thalassemia trait    Past Surgical History:  Procedure Laterality Date   COLONOSCOPY WITH PROPOFOL  N/A 07/07/2020   Surgeon: Cindie Carlin POUR, DO; nonbleeding internal hemorrhoids, diverticulosis in the sigmoid and descending colon, Four 3-5 mm tubular adenomas.  Repeat in 5 years (2026).   CYSTOSCOPY WITH RETROGRADE PYELOGRAM, URETEROSCOPY AND STENT PLACEMENT Right 06/07/2021   Procedure: CYSTOSCOPY WITH RETROGRADE PYELOGRAM, URETEROSCOPY AND STENT PLACEMENT;  Surgeon: Sherrilee Belvie CROME, MD;  Location: [facility];  Service: Urology;  Laterality: Right;   EXTRACORPOREAL SHOCK WAVE LITHOTRIPSY Right 05/08/2021   Procedure: EXTRACORPOREAL SHOCK WAVE LITHOTRIPSY (ESWL);  Surgeon: Sherrilee Belvie CROME, MD;  Location: [facility];  Service: Urology;  Laterality: Right;   HOLMIUM LASER APPLICATION Right 06/07/2021   Procedure: HOLMIUM LASER APPLICATION;  Surgeon: Sherrilee Belvie CROME, MD;  Location: [facility];  Service: Urology;  Laterality: Right;   MENISCECTOMY Left    ORIF FEMUR FRACTURE Left    POLYPECTOMY  07/07/2020   Procedure: POLYPECTOMY;  Surgeon: Cindie Carlin POUR, DO;  Location: [facility];  Service: Endoscopy;;     SOCIAL HISTORY:  Social History   Socioeconomic History   Marital status: Significant Other    Spouse name: Not on file   Number of children: Not on file   Years of education: Not on file   Highest education level: Not on file  Occupational History   Not on file  Tobacco  Use   Smoking status: Never   Smokeless tobacco: Never  Vaping Use   Vaping status: Never Used  Substance and Sexual Activity   Alcohol use: Yes    Comment: rare   Drug use: Never   Sexual activity: Not on file  Other Topics Concern   Not on file  Social History Narrative   Not on file   Social Drivers of Health   Financial Resource Strain: Low Risk  (08/08/2020)   Overall Financial Resource Strain (CARDIA)    Difficulty of  Paying Living Expenses: Not hard at all  Food Insecurity: No Food Insecurity (08/08/2020)   Hunger Vital Sign    Worried About Running Out of Food in the Last Year: Never true    Ran Out of Food in the Last Year: Never true  Transportation Needs: No Transportation Needs (08/08/2020)   PRAPARE - Administrator, Civil Service (Medical): No    Lack of Transportation (Non-Medical): No  Physical Activity: Inactive (08/08/2020)   Exercise Vital Sign    Days of Exercise per Week: 0 days    Minutes of Exercise per Session: 0 min  Stress: No Stress Concern Present (08/08/2020)   [employer] of Occupational Health - Occupational Stress Questionnaire    Feeling of Stress : Not at all  Social Connections: Moderately Isolated (08/08/2020)   Social Connection and Isolation Panel    Frequency of Communication with Friends and Family: More than three times a week    Frequency of Social Gatherings with Friends and Family: More than three times a week    Attends Religious Services: Never    Database administrator or Organizations: No    Attends Banker Meetings: Never    Marital Status: Living with partner  Intimate Partner Violence: Not At Risk (08/08/2020)   Humiliation, Afraid, Rape, and Kick questionnaire    Fear of Current or Ex-Partner: No    Emotionally Abused: No    Physically Abused: No    Sexually Abused: No    FAMILY HISTORY:  Family History  Problem Relation Age of Onset   Hypertension Mother    Diabetes Mother    Cancer Mother    Diabetes Father    Asthma Son    Severe sprains Neg Hx    Scoliosis Neg Hx     CURRENT MEDICATIONS:  Outpatient Encounter Medications as of 04/21/2024  Medication Sig   ACCU-CHEK GUIDE TEST test strip SMARTSIG:Strip(s)   Accu-Chek Softclix Lancets lancets SMARTSIG:Lancet Topical   atorvastatin (LIPITOR) 40 MG tablet Take 40 mg by mouth at bedtime.    Cholecalciferol (EQL VITAMIN D3) 50 MCG (2000 UT) CAPS Take 2,000  Units by mouth daily.   diclofenac  (FLECTOR ) 1.3 % PTCH Place 1 patch onto the skin 2 (two) times daily.   DROPLET PEN NEEDLES 32G X 5 MM MISC    HYDROcodone -acetaminophen  (NORCO/VICODIN) 5-325 MG tablet Take 1 tablet by mouth every 6 (six) hours as needed for severe pain.   Iron-Folic Acid -Vit B12 (IRON FORMULA PO) Take 5 mLs by mouth as directed. Liquid iron   loratadine-pseudoephedrine (CLARITIN-D 24-HOUR) 10-240 MG 24 hr tablet Take 1 tablet by mouth every other day.    losartan (COZAAR) 25 MG tablet Take 25 mg by mouth daily.   MOUNJARO 7.5 MG/0.5ML Pen    pioglitazone (ACTOS) 30 MG tablet Take 30 mg by mouth daily.   predniSONE  (DELTASONE ) 20 MG tablet Take 2 tablets (40 mg total) by mouth daily with  breakfast. For the next four days   ramipril (ALTACE) 5 MG capsule Take 5 mg by mouth daily.   tamsulosin  (FLOMAX ) 0.4 MG CAPS capsule Take 1 capsule (0.4 mg total) by mouth daily.   tiZANidine  (ZANAFLEX ) 4 MG tablet TAKE 1 TABLET BY MOUTH EVERY 6 HOURS AS NEEDED FOR MUSCLE SPASMS FOR UP TO 3 DAYS   TRESIBA FLEXTOUCH 100 UNIT/ML FlexTouch Pen Inject 20 Units into the skin daily.   vitamin B-12 (CYANOCOBALAMIN ) 1000 MCG tablet Take 1,000 mcg by mouth daily.   No facility-administered encounter medications on file as of 04/21/2024.    ALLERGIES:  No Known Allergies   PHYSICAL EXAM:  ECOG PERFORMANCE STATUS: 1 - Symptomatic but completely ambulatory  Vitals:   04/21/24 1127  BP: 135/75  Pulse: 96  Resp: 18  Temp: (!) 96.7 F (35.9 C)  SpO2: 99%   Filed Weights   04/21/24 1127  Weight: 169 lb 5 oz (76.8 kg)   Physical Exam Constitutional:      Appearance: Normal appearance.  Cardiovascular:     Rate and Rhythm: Normal rate and regular rhythm.  Pulmonary:     Effort: Pulmonary effort is normal.     Breath sounds: Normal breath sounds.  Abdominal:     General: Bowel sounds are normal.     Palpations: Abdomen is soft.  Musculoskeletal:        General: No swelling. Normal  range of motion.  Neurological:     Mental Status: He is alert and oriented to person, place, and time. Mental status is at baseline.      LABORATORY DATA:  I have reviewed the labs as listed.  CBC    Component Value Date/Time   WBC 7.6 08/05/2022 1003   RBC 4.39 08/05/2022 1003   HGB 8.8 (L) 08/05/2022 1003   HCT 28.1 (L) 08/05/2022 1003   PLT 225 08/05/2022 1003   MCV 64.0 (L) 08/05/2022 1003   MCH 20.0 (L) 08/05/2022 1003   MCHC 31.3 08/05/2022 1003   RDW 16.2 (H) 08/05/2022 1003   LYMPHSABS 1.3 05/16/2021 1446   MONOABS 0.5 05/16/2021 1446   EOSABS 0.1 05/16/2021 1446   BASOSABS 0.1 05/16/2021 1446      Latest Ref Rng & Units 10/30/2022    8:30 AM 08/05/2022   10:03 AM 05/16/2021    2:46 PM  CMP  Glucose 70 - 99 mg/dL 89  817  780   BUN 8 - 27 mg/dL 17  20  17    Creatinine 0.76 - 1.27 mg/dL 8.95  8.85  8.92   Sodium 134 - 144 mmol/L 140  137  135   Potassium 3.5 - 5.2 mmol/L 4.0  3.9  3.7   Chloride 96 - 106 mmol/L 104  105  103   CO2 20 - 29 mmol/L 20  24  27    Calcium 8.6 - 10.2 mg/dL 8.9  8.7  8.5   Total Protein 6.5 - 8.1 g/dL  6.8  6.6   Total Bilirubin 0.3 - 1.2 mg/dL  0.8  1.0   Alkaline Phos 38 - 126 U/L  57  63   AST 15 - 41 U/L  19  16   ALT 0 - 44 U/L  19  17     DIAGNOSTIC IMAGING:  I have independently reviewed the relevant imaging and discussed with the patient.  ASSESSMENT & PLAN: 1.  Microcytic anemia, secondary to iron deficiency and thalassemia trait - Patient was initially seen at the request  of Dr. Sheryle.   - He has a history of thalassemia trait.  He has [place] ancestry.  Most of his family members reportedly have thalassemia trait. - No history of blood transfusions. - Colonoscopy from October 2021 showed some internal nonbleeding hemorrhoids and for polyps that were removed and sent for testing.  Pathology was negative.  - SPEP was normal. - He received IV Feraheme  x 2 in September 2021, reported slightly improved energy.  No  improvement in hemoglobin after infusions. - He has been taking liquid iron daily any adverse side effects - Denies any bleeding per rectum or melena.   - Energy levels and dyspnea on exertion are at baseline.  He has also noticed some dizziness when he stands. -Most recent labs from [facility] showed a ferritin of 12 and iron level of 48.  Most recent hemoglobin is from 06/11/23 which was 10.1. -We discussed continuing liquid iron as he is tolerating well but we will give him 2 doses of IV Feraheme  as he had in the past. -Recommend he follow-up in approximately 3 months with labs a few days before.  2.  B12 deficiency -She continues oral B12 supplements. -We will recheck B12 levels at his next visit.  3.  CKD: - He has mild chronic kidney disease, with baseline creatinine ranging from about 1.1 to 1.4   PLAN SUMMARY & DISPOSITION: Recommend 2 doses of IV Feraheme  and return to clinic in approximately 3 months with labs a few days before.  He will continue liquid iron and B12 supplements.  Will recheck all these labs when he returns.  All questions were answered. The patient knows to call the clinic with any problems, questions or concerns.  Medical decision making: Low  Time spent on visit: I spent 20 minutes counseling the patient face to face. The total time spent in the appointment was 30 minutes and more than 50% was on counseling.   Delon FORBES Hope, NP  05/25/2021 10:41 AM

## 2024-04-26 ENCOUNTER — Inpatient Hospital Stay

## 2024-04-26 VITALS — BP 144/77 | HR 85 | Temp 97.6°F | Resp 19 | Wt 175.2 lb

## 2024-04-26 DIAGNOSIS — D509 Iron deficiency anemia, unspecified: Secondary | ICD-10-CM

## 2024-04-26 DIAGNOSIS — N189 Chronic kidney disease, unspecified: Secondary | ICD-10-CM | POA: Diagnosis not present

## 2024-04-26 DIAGNOSIS — D563 Thalassemia minor: Secondary | ICD-10-CM | POA: Diagnosis not present

## 2024-04-26 DIAGNOSIS — E538 Deficiency of other specified B group vitamins: Secondary | ICD-10-CM | POA: Diagnosis not present

## 2024-04-26 MED ORDER — SODIUM CHLORIDE 0.9 % IV SOLN
510.0000 mg | Freq: Once | INTRAVENOUS | Status: AC
  Administered 2024-04-26: 510 mg via INTRAVENOUS
  Filled 2024-04-26: qty 510

## 2024-04-26 MED ORDER — SODIUM CHLORIDE 0.9 % IV SOLN
Freq: Once | INTRAVENOUS | Status: AC
Start: 2024-04-26 — End: 2024-04-26

## 2024-04-26 NOTE — Progress Notes (Signed)
 Patient presents today for iron infusion. Patient is in satisfactory condition with no new complaints voiced.  Vital signs are stable.  We will proceed with infusion per provider orders.    Peripheral IV started with good blood return pre and post infusion.  Feraheme 510 mg given today per MD orders. Tolerated infusion without adverse affects. Vital signs stable. No complaints at this time. Discharged from clinic ambulatory in stable condition. Alert and oriented x 3. F/U with [facility] as scheduled.

## 2024-04-26 NOTE — Patient Instructions (Signed)

## 2024-04-28 DIAGNOSIS — M1712 Unilateral primary osteoarthritis, left knee: Secondary | ICD-10-CM | POA: Diagnosis not present

## 2024-04-28 DIAGNOSIS — M170 Bilateral primary osteoarthritis of knee: Secondary | ICD-10-CM | POA: Diagnosis not present

## 2024-04-28 DIAGNOSIS — M1711 Unilateral primary osteoarthritis, right knee: Secondary | ICD-10-CM | POA: Diagnosis not present

## 2024-05-03 ENCOUNTER — Inpatient Hospital Stay

## 2024-05-03 VITALS — BP 125/57 | HR 80 | Temp 96.6°F | Resp 18 | Wt 175.6 lb

## 2024-05-03 DIAGNOSIS — D509 Iron deficiency anemia, unspecified: Secondary | ICD-10-CM | POA: Diagnosis not present

## 2024-05-03 DIAGNOSIS — N189 Chronic kidney disease, unspecified: Secondary | ICD-10-CM | POA: Diagnosis not present

## 2024-05-03 DIAGNOSIS — E538 Deficiency of other specified B group vitamins: Secondary | ICD-10-CM | POA: Diagnosis not present

## 2024-05-03 DIAGNOSIS — D563 Thalassemia minor: Secondary | ICD-10-CM | POA: Diagnosis not present

## 2024-05-03 MED ORDER — SODIUM CHLORIDE 0.9 % IV SOLN
510.0000 mg | Freq: Once | INTRAVENOUS | Status: AC
  Administered 2024-05-03: 510 mg via INTRAVENOUS
  Filled 2024-05-03: qty 510

## 2024-05-03 MED ORDER — SODIUM CHLORIDE 0.9 % IV SOLN: Freq: Once | INTRAVENOUS | Status: AC

## 2024-05-03 NOTE — Progress Notes (Signed)
 Patient presents today for iron infusion.  Patient is in satisfactory condition with no new complaints voiced.  Vital signs are stable.  We will proceed with infusion per provider orders.    Peripheral IV started with good blood return pre and post infusion.  Treatment given today per MD orders. Tolerated infusion without adverse affects. Vital signs stable. No complaints at this time. Discharged from clinic ambulatory in stable condition. Alert and oriented x 3. F/U with [facility] as scheduled.

## 2024-05-03 NOTE — Patient Instructions (Signed)

## 2024-05-04 ENCOUNTER — Encounter: Payer: Self-pay | Admitting: Oncology

## 2024-05-05 DIAGNOSIS — M170 Bilateral primary osteoarthritis of knee: Secondary | ICD-10-CM | POA: Diagnosis not present

## 2024-05-20 DIAGNOSIS — M170 Bilateral primary osteoarthritis of knee: Secondary | ICD-10-CM | POA: Diagnosis not present

## 2024-05-25 DIAGNOSIS — M170 Bilateral primary osteoarthritis of knee: Secondary | ICD-10-CM | POA: Diagnosis not present

## 2024-05-27 DIAGNOSIS — M170 Bilateral primary osteoarthritis of knee: Secondary | ICD-10-CM | POA: Diagnosis not present

## 2024-06-01 DIAGNOSIS — M170 Bilateral primary osteoarthritis of knee: Secondary | ICD-10-CM | POA: Diagnosis not present

## 2024-06-04 DIAGNOSIS — M170 Bilateral primary osteoarthritis of knee: Secondary | ICD-10-CM | POA: Diagnosis not present

## 2024-06-08 DIAGNOSIS — M170 Bilateral primary osteoarthritis of knee: Secondary | ICD-10-CM | POA: Diagnosis not present

## 2024-06-08 DIAGNOSIS — M1710 Unilateral primary osteoarthritis, unspecified knee: Secondary | ICD-10-CM | POA: Diagnosis not present

## 2024-06-11 DIAGNOSIS — M170 Bilateral primary osteoarthritis of knee: Secondary | ICD-10-CM | POA: Diagnosis not present

## 2024-06-16 ENCOUNTER — Encounter: Payer: Self-pay | Admitting: *Deleted

## 2024-06-16 NOTE — Progress Notes (Signed)
 William Washington                                          MRN: [id number]   06/16/2024   The [facility]I Quality Team Specialist reviewed this patient medical record for the purposes of chart review for care gap closure. The following were reviewed: chart review for care gap closure-kidney health evaluation for diabetes:eGFR  and uACR.    VBCI Quality Team

## 2024-06-21 DIAGNOSIS — E785 Hyperlipidemia, unspecified: Secondary | ICD-10-CM | POA: Diagnosis not present

## 2024-06-21 DIAGNOSIS — E1129 Type 2 diabetes mellitus with other diabetic kidney complication: Secondary | ICD-10-CM | POA: Diagnosis not present

## 2024-06-21 DIAGNOSIS — Z79899 Other long term (current) drug therapy: Secondary | ICD-10-CM | POA: Diagnosis not present

## 2024-06-21 DIAGNOSIS — D649 Anemia, unspecified: Secondary | ICD-10-CM | POA: Diagnosis not present

## 2024-06-28 DIAGNOSIS — Z79899 Other long term (current) drug therapy: Secondary | ICD-10-CM | POA: Diagnosis not present

## 2024-06-28 DIAGNOSIS — Z0001 Encounter for general adult medical examination with abnormal findings: Secondary | ICD-10-CM | POA: Diagnosis not present

## 2024-06-28 DIAGNOSIS — R800 Isolated proteinuria: Secondary | ICD-10-CM | POA: Diagnosis not present

## 2024-06-28 DIAGNOSIS — I10 Essential (primary) hypertension: Secondary | ICD-10-CM | POA: Diagnosis not present

## 2024-06-28 DIAGNOSIS — D509 Iron deficiency anemia, unspecified: Secondary | ICD-10-CM | POA: Diagnosis not present

## 2024-06-28 DIAGNOSIS — E1129 Type 2 diabetes mellitus with other diabetic kidney complication: Secondary | ICD-10-CM | POA: Diagnosis not present

## 2024-06-28 DIAGNOSIS — I2510 Atherosclerotic heart disease of native coronary artery without angina pectoris: Secondary | ICD-10-CM | POA: Diagnosis not present

## 2024-06-28 DIAGNOSIS — E785 Hyperlipidemia, unspecified: Secondary | ICD-10-CM | POA: Diagnosis not present

## 2024-06-28 DIAGNOSIS — I447 Left bundle-branch block, unspecified: Secondary | ICD-10-CM | POA: Diagnosis not present

## 2024-06-28 DIAGNOSIS — G4733 Obstructive sleep apnea (adult) (pediatric): Secondary | ICD-10-CM | POA: Diagnosis not present

## 2024-07-07 ENCOUNTER — Inpatient Hospital Stay: Attending: Oncology

## 2024-07-07 DIAGNOSIS — E538 Deficiency of other specified B group vitamins: Secondary | ICD-10-CM | POA: Insufficient documentation

## 2024-07-07 DIAGNOSIS — D563 Thalassemia minor: Secondary | ICD-10-CM | POA: Diagnosis not present

## 2024-07-07 DIAGNOSIS — R200 Anesthesia of skin: Secondary | ICD-10-CM | POA: Insufficient documentation

## 2024-07-07 DIAGNOSIS — R718 Other abnormality of red blood cells: Secondary | ICD-10-CM

## 2024-07-07 DIAGNOSIS — R1310 Dysphagia, unspecified: Secondary | ICD-10-CM | POA: Insufficient documentation

## 2024-07-07 DIAGNOSIS — R5383 Other fatigue: Secondary | ICD-10-CM | POA: Diagnosis not present

## 2024-07-07 DIAGNOSIS — D509 Iron deficiency anemia, unspecified: Secondary | ICD-10-CM | POA: Diagnosis not present

## 2024-07-07 DIAGNOSIS — R42 Dizziness and giddiness: Secondary | ICD-10-CM | POA: Insufficient documentation

## 2024-07-07 DIAGNOSIS — N189 Chronic kidney disease, unspecified: Secondary | ICD-10-CM | POA: Diagnosis not present

## 2024-07-07 LAB — FERRITIN: Ferritin: 72 ng/mL (ref 24–336)

## 2024-07-07 LAB — CBC WITH DIFFERENTIAL/PLATELET
Abs Immature Granulocytes: 0.03 K/uL (ref 0.00–0.07)
Basophils Absolute: 0 K/uL (ref 0.0–0.1)
Basophils Relative: 1 %
Eosinophils Absolute: 0.1 K/uL (ref 0.0–0.5)
Eosinophils Relative: 3 %
HCT: 28 % — ABNORMAL LOW (ref 39.0–52.0)
Hemoglobin: 8.8 g/dL — ABNORMAL LOW (ref 13.0–17.0)
Immature Granulocytes: 1 %
Lymphocytes Relative: 24 %
Lymphs Abs: 1.4 K/uL (ref 0.7–4.0)
MCH: 21.2 pg — ABNORMAL LOW (ref 26.0–34.0)
MCHC: 31.4 g/dL (ref 30.0–36.0)
MCV: 67.5 fL — ABNORMAL LOW (ref 80.0–100.0)
Monocytes Absolute: 0.6 K/uL (ref 0.1–1.0)
Monocytes Relative: 10 %
Neutro Abs: 3.5 K/uL (ref 1.7–7.7)
Neutrophils Relative %: 61 %
Platelets: 185 K/uL (ref 150–400)
RBC: 4.15 MIL/uL — ABNORMAL LOW (ref 4.22–5.81)
RDW: 17.2 % — ABNORMAL HIGH (ref 11.5–15.5)
WBC: 5.7 K/uL (ref 4.0–10.5)
nRBC: 0 % (ref 0.0–0.2)

## 2024-07-07 LAB — COMPREHENSIVE METABOLIC PANEL WITH GFR
ALT: 17 U/L (ref 0–44)
AST: 20 U/L (ref 15–41)
Albumin: 4.2 g/dL (ref 3.5–5.0)
Alkaline Phosphatase: 58 U/L (ref 38–126)
Anion gap: 9 (ref 5–15)
BUN: 26 mg/dL — ABNORMAL HIGH (ref 8–23)
CO2: 27 mmol/L (ref 22–32)
Calcium: 9 mg/dL (ref 8.9–10.3)
Chloride: 104 mmol/L (ref 98–111)
Creatinine, Ser: 1.18 mg/dL (ref 0.61–1.24)
GFR, Estimated: >60 mL/min (ref >60)
Glucose, Bld: 88 mg/dL (ref 70–99)
Potassium: 4.2 mmol/L (ref 3.5–5.1)
Sodium: 140 mmol/L (ref 135–145)
Total Bilirubin: 1.1 mg/dL (ref 0.0–1.2)
Total Protein: 6.5 g/dL (ref 6.5–8.1)

## 2024-07-07 LAB — FOLATE: Folate: 11.7 ng/mL (ref >5.9)

## 2024-07-07 LAB — IRON AND TIBC
Iron: 93 ug/dL (ref 45–182)
Saturation Ratios: 29 % (ref 17.9–39.5)
TIBC: 322 ug/dL (ref 250–450)
UIBC: 229 ug/dL

## 2024-07-07 LAB — VITAMIN B12: Vitamin B-12: 970 pg/mL — ABNORMAL HIGH (ref 180–914)

## 2024-07-07 LAB — LACTATE DEHYDROGENASE: LDH: 185 U/L (ref 98–192)

## 2024-07-07 LAB — VITAMIN D 25 HYDROXY (VIT D DEFICIENCY, FRACTURES): Vit D, 25-Hydroxy: 43.01 ng/mL (ref 30–100)

## 2024-07-10 LAB — COPPER, SERUM: Copper: 82 ug/dL (ref 69–132)

## 2024-07-13 LAB — METHYLMALONIC ACID, SERUM: Methylmalonic Acid, Quantitative: 224 nmol/L (ref 0–378)

## 2024-07-14 ENCOUNTER — Inpatient Hospital Stay: Attending: Oncology | Admitting: Oncology

## 2024-07-14 VITALS — BP 136/69 | HR 83 | Temp 97.7°F | Resp 18 | Ht 64.0 in | Wt 179.0 lb

## 2024-07-14 DIAGNOSIS — N189 Chronic kidney disease, unspecified: Secondary | ICD-10-CM | POA: Diagnosis not present

## 2024-07-14 DIAGNOSIS — R718 Other abnormality of red blood cells: Secondary | ICD-10-CM

## 2024-07-14 DIAGNOSIS — E538 Deficiency of other specified B group vitamins: Secondary | ICD-10-CM | POA: Diagnosis not present

## 2024-07-14 DIAGNOSIS — D563 Thalassemia minor: Secondary | ICD-10-CM | POA: Insufficient documentation

## 2024-07-14 DIAGNOSIS — D509 Iron deficiency anemia, unspecified: Secondary | ICD-10-CM | POA: Diagnosis not present

## 2024-07-14 NOTE — Progress Notes (Signed)
 [facility] [address], [place] [address]   CLINIC:  Medical Oncology/Hematology  PCP:  Sheryle Carwin, MD [address] [place] [address] [phone]   REASON FOR VISIT:  Follow-up for iron deficiency anemia and thalassemia trait  CURRENT THERAPY: Intermittent IV iron infusions  INTERVAL HISTORY:  Mr. William Washington 78 y.o. male returns for routine follow-up of microcytic anemia, which is secondary to thalassemia trait and iron deficiency.    Patient has past medical history significant for OSA on CPAP, hypertension, mitral regurgitation, aortic stenosis, diabetes and hyperlipidemia.  No changes in his baseline health since his last visit with us .  He does have chronic neck pain and is followed by Ortho.  He has received IV iron in the past back in 2021 and here more recently on 04/26/2024 and 05/03/2024.  Reportedly tolerated well.  Reports his appetite is 100% energy levels are 475%.  He denies any pain.  He denies any bleeding. Otherwise, denies any rectal bleeding, melena or hematochezia.  He did have a colonoscopy back in 2021 which showed several polyps and diverticulosis and several internal hemorrhoids that were nonbleeding.  He denies any increased fatigue or changes to his baseline energy levels.  Reports he has been anemic all his life and he feels about the same.  Has some chronic stable knee and leg pain and rates it a 6 out of 10.  No recent hospitalizations, surgeries, or changes in baseline health status.   REVIEW OF SYSTEMS:  Review of Systems  Constitutional:  Positive for fatigue.  HENT:   Positive for trouble swallowing.   Neurological:  Positive for dizziness and numbness.      PAST MEDICAL/SURGICAL HISTORY:  Past Medical History:  Diagnosis Date   Colon polyp    Diabetes mellitus without complication (HCC)    Hypercholesteremia    Hypertension    Iron deficiency anemia    LBBB (left bundle branch block)     Nephrolithiasis    Sleep apnea    Thalassemia trait    Past Surgical History:  Procedure Laterality Date   COLONOSCOPY WITH PROPOFOL  N/A 07/07/2020   Surgeon: Cindie Carlin POUR, DO; nonbleeding internal hemorrhoids, diverticulosis in the sigmoid and descending colon, Four 3-5 mm tubular adenomas.  Repeat in 5 years (2026).   CYSTOSCOPY WITH RETROGRADE PYELOGRAM, URETEROSCOPY AND STENT PLACEMENT Right 06/07/2021   Procedure: CYSTOSCOPY WITH RETROGRADE PYELOGRAM, URETEROSCOPY AND STENT PLACEMENT;  Surgeon: Sherrilee Belvie CROME, MD;  Location: [facility];  Service: Urology;  Laterality: Right;   EXTRACORPOREAL SHOCK WAVE LITHOTRIPSY Right 05/08/2021   Procedure: EXTRACORPOREAL SHOCK WAVE LITHOTRIPSY (ESWL);  Surgeon: Sherrilee Belvie CROME, MD;  Location: [facility];  Service: Urology;  Laterality: Right;   HOLMIUM LASER APPLICATION Right 06/07/2021   Procedure: HOLMIUM LASER APPLICATION;  Surgeon: Sherrilee Belvie CROME, MD;  Location: [facility];  Service: Urology;  Laterality: Right;   MENISCECTOMY Left    ORIF FEMUR FRACTURE Left    POLYPECTOMY  07/07/2020   Procedure: POLYPECTOMY;  Surgeon: Cindie Carlin POUR, DO;  Location: [facility];  Service: Endoscopy;;     SOCIAL HISTORY:  Social History   Socioeconomic History   Marital status: Significant Other    Spouse name: Not on file   Number of children: Not on file   Years of education: Not on file   Highest education level: Not on file  Occupational History   Not on file  Tobacco Use   Smoking status: Never   Smokeless  tobacco: Never  Vaping Use   Vaping status: Never Used  Substance and Sexual Activity   Alcohol use: Yes    Comment: rare   Drug use: Never   Sexual activity: Not on file  Other Topics Concern   Not on file  Social History Narrative   Not on file   Social Drivers of Health   Financial Resource Strain: Low Risk  (08/08/2020)   Overall Financial Resource Strain (CARDIA)    Difficulty of Paying Living Expenses: Not hard  at all  Food Insecurity: No Food Insecurity (08/08/2020)   Hunger Vital Sign    Worried About Running Out of Food in the Last Year: Never true    Ran Out of Food in the Last Year: Never true  Transportation Needs: No Transportation Needs (08/08/2020)   PRAPARE - Administrator, Civil Service (Medical): No    Lack of Transportation (Non-Medical): No  Physical Activity: Inactive (08/08/2020)   Exercise Vital Sign    Days of Exercise per Week: 0 days    Minutes of Exercise per Session: 0 min  Stress: No Stress Concern Present (08/08/2020)   [employer] of Occupational Health - Occupational Stress Questionnaire    Feeling of Stress : Not at all  Social Connections: Moderately Isolated (08/08/2020)   Social Connection and Isolation Panel    Frequency of Communication with Friends and Family: More than three times a week    Frequency of Social Gatherings with Friends and Family: More than three times a week    Attends Religious Services: Never    Database Administrator or Organizations: No    Attends Banker Meetings: Never    Marital Status: Living with partner  Intimate Partner Violence: Not At Risk (08/08/2020)   Humiliation, Afraid, Rape, and Kick questionnaire    Fear of Current or Ex-Partner: No    Emotionally Abused: No    Physically Abused: No    Sexually Abused: No    FAMILY HISTORY:  Family History  Problem Relation Age of Onset   Hypertension Mother    Diabetes Mother    Cancer Mother    Diabetes Father    Asthma Son    Severe sprains Neg Hx    Scoliosis Neg Hx     CURRENT MEDICATIONS:  Outpatient Encounter Medications as of 07/14/2024  Medication Sig   ACCU-CHEK GUIDE TEST test strip SMARTSIG:Strip(s)   Accu-Chek Softclix Lancets lancets SMARTSIG:Lancet Topical   atorvastatin (LIPITOR) 40 MG tablet Take 40 mg by mouth at bedtime.    Cholecalciferol (EQL VITAMIN D3) 50 MCG (2000 UT) CAPS Take 2,000 Units by mouth daily.   diclofenac   (FLECTOR ) 1.3 % PTCH Place 1 patch onto the skin 2 (two) times daily.   DROPLET PEN NEEDLES 32G X 5 MM MISC    HYDROcodone -acetaminophen  (NORCO/VICODIN) 5-325 MG tablet Take 1 tablet by mouth every 6 (six) hours as needed for severe pain.   Iron-Folic Acid -Vit B12 (IRON FORMULA PO) Take 5 mLs by mouth as directed. Liquid iron   loratadine-pseudoephedrine (CLARITIN-D 24-HOUR) 10-240 MG 24 hr tablet Take 1 tablet by mouth every other day.    losartan (COZAAR) 25 MG tablet Take 25 mg by mouth daily.   MOUNJARO 7.5 MG/0.5ML Pen    pioglitazone (ACTOS) 30 MG tablet Take 30 mg by mouth daily.   predniSONE  (DELTASONE ) 20 MG tablet Take 2 tablets (40 mg total) by mouth daily with breakfast. For the next four days   ramipril (  ALTACE) 5 MG capsule Take 5 mg by mouth daily.   tamsulosin  (FLOMAX ) 0.4 MG CAPS capsule Take 1 capsule (0.4 mg total) by mouth daily.   tiZANidine  (ZANAFLEX ) 4 MG tablet TAKE 1 TABLET BY MOUTH EVERY 6 HOURS AS NEEDED FOR MUSCLE SPASMS FOR UP TO 3 DAYS   TRESIBA FLEXTOUCH 100 UNIT/ML FlexTouch Pen Inject 20 Units into the skin daily.   vitamin B-12 (CYANOCOBALAMIN ) 1000 MCG tablet Take 1,000 mcg by mouth daily.   No facility-administered encounter medications on file as of 07/14/2024.    ALLERGIES:  No Known Allergies   PHYSICAL EXAM:  ECOG PERFORMANCE STATUS: 1 - Symptomatic but completely ambulatory  Vitals:   07/14/24 1023  BP: 136/69  Pulse: 83  Resp: 18  Temp: 97.7 F (36.5 C)  SpO2: 97%    Filed Weights   07/14/24 1023  Weight: 179 lb (81.2 kg)    Physical Exam Constitutional:      Appearance: Normal appearance.  Cardiovascular:     Rate and Rhythm: Normal rate and regular rhythm.  Pulmonary:     Effort: Pulmonary effort is normal.     Breath sounds: Normal breath sounds.  Abdominal:     General: Bowel sounds are normal.     Palpations: Abdomen is soft.  Musculoskeletal:        General: No swelling. Normal range of motion.  Neurological:      Mental Status: He is alert and oriented to person, place, and time. Mental status is at baseline.      LABORATORY DATA:  I have reviewed the labs as listed.  CBC    Component Value Date/Time   WBC 5.7 07/07/2024 1015   RBC 4.15 (L) 07/07/2024 1015   HGB 8.8 (L) 07/07/2024 1015   HCT 28.0 (L) 07/07/2024 1015   PLT 185 07/07/2024 1015   MCV 67.5 (L) 07/07/2024 1015   MCH 21.2 (L) 07/07/2024 1015   MCHC 31.4 07/07/2024 1015   RDW 17.2 (H) 07/07/2024 1015   LYMPHSABS 1.4 07/07/2024 1015   MONOABS 0.6 07/07/2024 1015   EOSABS 0.1 07/07/2024 1015   BASOSABS 0.0 07/07/2024 1015      Latest Ref Rng & Units 07/07/2024   10:15 AM 10/30/2022    8:30 AM 08/05/2022   10:03 AM  CMP  Glucose 70 - 99 mg/dL 88  89  817   BUN 8 - 23 mg/dL 26  17  20    Creatinine 0.61 - 1.24 mg/dL 8.81  8.95  8.85   Sodium 135 - 145 mmol/L 140  140  137   Potassium 3.5 - 5.1 mmol/L 4.2  4.0  3.9   Chloride 98 - 111 mmol/L 104  104  105   CO2 22 - 32 mmol/L 27  20  24    Calcium 8.9 - 10.3 mg/dL 9.0  8.9  8.7   Total Protein 6.5 - 8.1 g/dL 6.5   6.8   Total Bilirubin 0.0 - 1.2 mg/dL 1.1   0.8   Alkaline Phos 38 - 126 U/L 58   57   AST 15 - 41 U/L 20   19   ALT 0 - 44 U/L 17   19     DIAGNOSTIC IMAGING:  I have independently reviewed the relevant imaging and discussed with the patient.  ASSESSMENT & PLAN: 1.  Microcytic anemia, secondary to iron deficiency and thalassemia trait - Patient was initially seen at the request of Dr. Sheryle.   - He has a history  of thalassemia trait.  He has [place] ancestry.  Most of his family members reportedly have thalassemia trait. - No history of blood transfusions. - Colonoscopy from October 2021 showed some internal nonbleeding hemorrhoids and for polyps that were removed and sent for testing.  Pathology was negative.  - SPEP was normal. - He received IV Feraheme  x 2 in September 2021, reported slightly improved energy.  -Had repeat iron infusions on 04/26/2024 and  05/03/2024 which she tolerated well but did not improve his hemoglobin. - He has been taking liquid iron daily any adverse side effects - Denies any bleeding per rectum or melena.   - Energy levels and dyspnea on exertion are at baseline.   - Labs from 07/07/2024 show unremarkable CMP, vitamin B12 970, iron panel shows saturation 29% with normal TIBC.  Ferritin 72, hemoglobin still low at 8.8.  Platelets 185 white blood cell count normal. -Recommend he follow-up in approximately 6 months with labs a few days before. -Discussed that given there is no evidence of nutritional deficiency and he has got mild CKD, will add an EPO level at his next visit to see if Retacrit or Epogen may be an option in the future.  For now, given thalassemia trait and he feels well, do not recommend anything additional.  2.  B12 deficiency -He continues oral B12 supplements. -We will recheck B12 levels at his next visit.  3.  CKD: - He has mild chronic kidney disease, with baseline creatinine ranging from about 1.1 to 1.4 -Will get EPO level at next visit to see if he might benefit from Retacrit or Epogen in the future.  All questions were answered. The patient knows to call the clinic with any problems, questions or concerns.  Medical decision making: Low  Time spent on visit: I spent 20 minutes counseling the patient face to face. The total time spent in the appointment was 30 minutes and more than 50% was on counseling.   Delon FORBES Hope, NP

## 2024-09-06 ENCOUNTER — Encounter: Payer: Self-pay | Admitting: *Deleted

## 2025-01-12 ENCOUNTER — Inpatient Hospital Stay

## 2025-01-17 ENCOUNTER — Other Ambulatory Visit (HOSPITAL_COMMUNITY)

## 2025-01-19 ENCOUNTER — Inpatient Hospital Stay: Admitting: Oncology

## 2025-02-11 ENCOUNTER — Ambulatory Visit: Admitting: Primary Care

## 2025-02-22 ENCOUNTER — Ambulatory Visit: Admitting: Urology
